# Patient Record
Sex: Female | Born: 2000 | Race: White | Hispanic: No | Smoking: Never smoker
Health system: Southern US, Community
[De-identification: ages and names within clinical notes are randomized; demographics above are authoritative.]

## PROBLEM LIST (undated history)

## (undated) DIAGNOSIS — K0889 Other specified disorders of teeth and supporting structures: Secondary | ICD-10-CM

## (undated) DIAGNOSIS — R600 Localized edema: Secondary | ICD-10-CM

## (undated) DIAGNOSIS — A6920 Lyme disease, unspecified: Secondary | ICD-10-CM

## (undated) DIAGNOSIS — M549 Dorsalgia, unspecified: Secondary | ICD-10-CM

## (undated) DIAGNOSIS — F418 Other specified anxiety disorders: Secondary | ICD-10-CM

## (undated) DIAGNOSIS — Z464 Encounter for fitting and adjustment of orthodontic device: Secondary | ICD-10-CM

## (undated) DIAGNOSIS — K219 Gastro-esophageal reflux disease without esophagitis: Secondary | ICD-10-CM

## (undated) DIAGNOSIS — D696 Thrombocytopenia, unspecified: Secondary | ICD-10-CM

## (undated) DIAGNOSIS — G43909 Migraine, unspecified, not intractable, without status migrainosus: Secondary | ICD-10-CM

## (undated) DIAGNOSIS — IMO0001 Reserved for inherently not codable concepts without codable children: Secondary | ICD-10-CM

## (undated) DIAGNOSIS — K5900 Constipation, unspecified: Secondary | ICD-10-CM

## (undated) DIAGNOSIS — N12 Tubulo-interstitial nephritis, not specified as acute or chronic: Secondary | ICD-10-CM

## (undated) HISTORY — DX: Migraine, unspecified, not intractable, without status migrainosus: G43.909

## (undated) HISTORY — DX: Dorsalgia, unspecified: M54.9

## (undated) HISTORY — DX: Constipation, unspecified: K59.00

## (undated) HISTORY — PX: NO PAST SURGERIES: SHX2092

## (undated) HISTORY — DX: Other specified anxiety disorders: F41.8

## (undated) HISTORY — DX: Other specified disorders of teeth and supporting structures: K08.89

## (undated) HISTORY — DX: Tubulo-interstitial nephritis, not specified as acute or chronic: N12

## (undated) HISTORY — PX: WISDOM TOOTH EXTRACTION: SHX21

## (undated) HISTORY — DX: Localized edema: R60.0

---
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
## 2019-06-13 NOTE — Progress Notes (Signed)
Frazier Richards, MD   Chief Complaint  Patient presents with  . Metrorrhagia    pt's last regular period was more than a year ago, had some spotting 3/20, started bleeding last night, was on OCP's but stopped about a year ago, no abnormal pain  . Hot Flashes    2-5 months ago through out the day    HPI:      [name] is a 18 y.o. No obstetric history on file. who LMP was Patient's last menstrual period was 06/13/2019 (exact date)., presents today for NP menstrual issues. Pt's menses used to be monthly, lasting 7 days, no BTB, with n/v and bad cramping with menses. Pt took NSAIDs with some improvement. Pt started on OCPs by PCP for dysmen in past and did continuous dosing with sx relief. Pt stopped OCPs about a yr ago and didn't resume menses. Had 1-2 days light spotting only 3/20, but started her period last night. Had seen PCP for sx eval but pt states no eval done and she just wanted pt to restart pills, so she started them last night with bleeding. Pt wonders why she didn't bleed after stopping pills. No known FH PCOS. 25# wt gain since 12/19. Had hot flashes a few months ago.  Pt is sex active, not using BC. Had neg UPTs at home, last one 1-2 months ago. Hx of constipation, depression.    Past Medical History:  Diagnosis Date  . Lyme disease   . Migraine   . Orthodontics    has bracees  . Reflux    as infant  . Thrombocytopenia ([place])     Past Surgical History:  Procedure Laterality Date  . NO PAST SURGERIES    . SEPTOPLASTY N/A 07/23/2016   Procedure: SEPTOPLASTY;  Surgeon: Clyde Canterbury, MD;  Location: [facility];  Service: ENT;  Laterality: N/A;    Family History  Problem Relation Age of Onset  . Juvenile Rhematoid Arthritis Sister   . Lupus Mother   . Fibromyalgia Mother     Social History   Socioeconomic History  . Marital status: Single    Spouse name: Not on file  . Number of children: Not on file  . Years of education: Not on file  .  Highest education level: Not on file  Occupational History  . Not on file  Social Needs  . Financial resource strain: Not on file  . Food insecurity    Worry: Not on file    Inability: Not on file  . Transportation needs    Medical: Not on file    Non-medical: Not on file  Tobacco Use  . Smoking status: Never Smoker  . Smokeless tobacco: Never Used  Substance and Sexual Activity  . Alcohol use: No  . Drug use: Never  . Sexual activity: Yes    Birth control/protection: None  Lifestyle  . Physical activity    Days per week: Not on file    Minutes per session: Not on file  . Stress: Not on file  Relationships  . Social Herbalist on phone: Not on file    Gets together: Not on file    Attends religious service: Not on file    Active member of club or organization: Not on file    Attends meetings of clubs or organizations: Not on file    Relationship status: Not on file  . Intimate partner violence    Fear of current or  ex partner: Not on file    Emotionally abused: Not on file    Physically abused: Not on file    Forced sexual activity: Not on file  Other Topics Concern  . Not on file  Social History Narrative  . Not on file    Outpatient Medications Prior to Visit  Medication Sig Dispense Refill  . cephALEXin (KEFLEX) 500 MG capsule TAKE 1 CAPSULE BY MOUTH TWICE A DAY FOR 7 DAYS, FOR INFECTION    . APRI 0.15-30 MG-MCG tablet Take 1 tablet by mouth daily.  3  . aluminum-magnesium hydroxide-simethicone (MAALOX) [id number] MG/5ML SUSP Take 30 mLs by mouth 4 (four) times daily -  before meals and at bedtime. 355 mL 0  . cefPROZIL (CEFZIL) 500 MG tablet Take 1 tablet (500 mg total) by mouth 2 (two) times daily. (Patient not taking: Reported on 05/31/2017) 20 tablet 0  . famotidine (PEPCID) 20 MG tablet Take 1 tablet (20 mg total) by mouth 2 (two) times daily. 60 tablet 0  . HYDROcodone-acetaminophen (NORCO/VICODIN) 5-325 MG tablet Take 1-2 tablets by mouth every 6  (six) hours as needed for moderate pain. (Patient not taking: Reported on 05/31/2017) 30 tablet 0  . ondansetron (ZOFRAN ODT) 4 MG disintegrating tablet Take 1 tablet (4 mg total) by mouth every 8 (eight) hours as needed for nausea or vomiting. 20 tablet 0  . predniSONE (STERAPRED UNI-PAK 21 TAB) 10 MG (21) TBPK tablet Take 6 tablets on day 1. Take 5 tablets on day 2. Take 4 tablets on day 3. Take 3 tablets on day 4. Take 2 tablets on day 5. Take 1 tablets on day 6. 21 tablet 0   No facility-administered medications prior to visit.       ROS:  Review of Systems  Constitutional: Negative for fatigue, fever and unexpected weight change.  Respiratory: Negative for cough, shortness of breath and wheezing.   Cardiovascular: Negative for chest pain, palpitations and leg swelling.  Gastrointestinal: Positive for constipation. Negative for blood in stool, diarrhea, nausea and vomiting.  Endocrine: Negative for cold intolerance, heat intolerance and polyuria.  Genitourinary: Negative for dyspareunia, dysuria, flank pain, frequency, genital sores, hematuria, menstrual problem, pelvic pain, urgency, vaginal bleeding, vaginal discharge and vaginal pain.  Musculoskeletal: Negative for back pain, joint swelling and myalgias.  Skin: Negative for rash.  Neurological: Positive for headaches. Negative for dizziness, syncope, light-headedness and numbness.  Hematological: Negative for adenopathy.  Psychiatric/Behavioral: Positive for agitation. Negative for confusion, sleep disturbance and suicidal ideas. The patient is not nervous/anxious.    BREAST: No symptoms   OBJECTIVE:   Vitals:  BP 96/60   Ht 5\' 4"  (1.626 m)   Wt 170 lb 9.6 oz (77.4 kg)   LMP 06/13/2019 (Exact Date)   BMI 29.28 kg/m   Physical Exam Vitals signs reviewed.  Constitutional:      Appearance: She is well-developed.  Neck:     Musculoskeletal: Normal range of motion.  Pulmonary:     Effort: Pulmonary effort is normal.   Genitourinary:    General: Normal vulva.     Pubic Area: No rash.      Labia:        Right: No rash, tenderness or lesion.        Left: No rash, tenderness or lesion.      Vagina: Normal. No vaginal discharge, erythema or tenderness.     Cervix: Normal.     Uterus: Normal. Not enlarged and not tender.  Adnexa: Right adnexa normal and left adnexa normal.       Right: No mass or tenderness.         Left: No mass or tenderness.    Musculoskeletal: Normal range of motion.  Skin:    General: Skin is warm and dry.  Neurological:     General: No focal deficit present.     Mental Status: She is alert and oriented to person, place, and time.  Psychiatric:        Mood and Affect: Mood normal.        Behavior: Behavior normal.        Thought Content: Thought content normal.        Judgment: Judgment normal.     Results: Results for orders placed or performed in visit on 06/14/19 (from the past 24 hour(s))  POCT urine pregnancy     Status: Normal   Collection Time: 06/14/19  2:49 PM  Result Value Ref Range   Preg Test, Ur Negative Negative     Assessment/Plan: Amenorrhea - Plan: POCT urine pregnancy, TSH + free T4, Prolactin; Since 3/20; Started spontaneous menses yesterday and pt started OCPs. Neg UPT today. Check labs today. If neg, reassurance and can eval further with labs in future once stops hormones. Could be due to wt gain. Since pt sex active, suggested OCP use for cycle control and pregnancy prevention. Condoms.   Thyroid disorder screening - Plan: TSH + free T4  Screening for STDs   Return if symptoms worsen or fail to improve.  Pleasant Bensinger B. Markiesha Delia, PA-C 06/14/2019 2:53 PM

## 2019-06-14 ENCOUNTER — Other Ambulatory Visit (HOSPITAL_COMMUNITY)
Admission: RE | Admit: 2019-06-14 | Discharge: 2019-06-14 | Disposition: A | Discharge status: Home / Self Care | Payer: Medicaid Other | Source: Ambulatory Visit | Attending: Obstetrics and Gynecology | Admitting: Obstetrics and Gynecology

## 2019-06-14 ENCOUNTER — Ambulatory Visit (INDEPENDENT_AMBULATORY_CARE_PROVIDER_SITE_OTHER): Payer: Medicaid Other | Admitting: Obstetrics and Gynecology

## 2019-06-14 ENCOUNTER — Encounter: Payer: Self-pay | Admitting: Obstetrics and Gynecology

## 2019-06-14 ENCOUNTER — Other Ambulatory Visit: Payer: Self-pay

## 2019-06-14 VITALS — BP 96/60 | Ht 64.0 in | Wt 170.6 lb

## 2019-06-14 DIAGNOSIS — Z113 Encounter for screening for infections with a predominantly sexual mode of transmission: Secondary | ICD-10-CM

## 2019-06-14 DIAGNOSIS — N912 Amenorrhea, unspecified: Secondary | ICD-10-CM

## 2019-06-14 DIAGNOSIS — Z1329 Encounter for screening for other suspected endocrine disorder: Secondary | ICD-10-CM | POA: Diagnosis not present

## 2019-06-14 DIAGNOSIS — Z3202 Encounter for pregnancy test, result negative: Secondary | ICD-10-CM

## 2019-06-14 LAB — POCT URINE PREGNANCY: Preg Test, Ur: NEGATIVE

## 2019-06-14 NOTE — Patient Instructions (Signed)
I value your feedback and entrusting us with your care. If you get a [facility] patient survey, I would appreciate you taking the time to let us know about your experience today. Thank you! 

## 2019-06-15 LAB — TSH+FREE T4
Free T4: 1.07 ng/dL (ref 0.93–1.60)
TSH: 2.05 u[IU]/mL (ref 0.450–4.500)

## 2019-06-15 LAB — PROLACTIN: Prolactin: 5.4 ng/mL (ref 4.8–23.3)

## 2019-06-15 NOTE — Progress Notes (Signed)
Pls let pt know labs normal. Cont OCPs. F/u for further eval after OCPs in future if menses don't resume. Thx

## 2019-06-15 NOTE — Progress Notes (Signed)
Called pt, no answer, LVMTRC. 

## 2019-06-16 LAB — CERVICOVAGINAL ANCILLARY ONLY
Chlamydia: NEGATIVE
Neisseria Gonorrhea: NEGATIVE

## 2019-06-16 NOTE — Progress Notes (Signed)
Called pt again, no answer, LVMTRC.

## 2020-02-06 NOTE — Progress Notes (Signed)
New Obstetric Patient H&P    Chief Complaint: "Desires prenatal care"   History of Present Illness: Vanessa Vincent is a 19 y.o. G1P0000 Not Hispanic or Latino female, LMP 12/09/2019 presents with amenorrhea and positive home pregnancy test. Based on her  LMP, her EDD is 09/14/2020. and her EGA is 8wk4d. She was taking OCPs continuously and was amenorrheic. She stopped her OCPs in January and her 2/18 LMP was her first menses off pills.  She had a urine pregnancy test which was positive around 01/09/2020.  Since her LMP she claims she has experienced nausea, vomiting, a brown discharge a few days ago, urinary frequency and headaches. Her nausea and vomiting is worse during the night and in the AM. Has taken Zofran 4 mgm on a couple of occasions. . She denies frank vaginal bleeding. Her past medical history is remarkable for common migraines, pyelonephritis (age 26) and frequent UTIs  She was also treated for anxiety/depression for a couple months at the end of last year. She self discontinued the medication after a couple months because she did not like how it made her feel. She was also getting counseling, but stopped when her therapist left the practice.   Since her LMP, she admits to the use of tobacco products  No SInce her LMP, she admits to the use of alcohol: No Since her LMP, she admits to the use of MJ. States was using daily until her +UPT. Has stopped smoking since then. She claims she has gained   no pounds since the start of her pregnancy.  There are cats in the home in the home  yes , 2 Outdoor cats (sister's cats) She admits close contact with children on a regular basis  yes  She has had chicken pox in the past no She has had Tuberculosis exposures, symptoms, or previously tested positive for TB   no Current or past history of domestic violence. no  Genetic Screening/Teratology Counseling: (Includes patient, baby's father, or anyone in either family with:)   109.  Patient's age >/= 38 at [facility]  no 2. Thalassemia ([place], [place], [place], or Asian background): MCV<80  no 3. Neural tube defect (meningomyelocele, spina bifida, anencephaly)  no 4. Congenital heart defect  Yes, possibly in FOB's cousin 5. Down syndrome  no 6. Tay-Sachs (Jewish, [place])  no 7. Canavan's Disease  no 8. Sickle cell disease or trait (African)  no  9. Hemophilia or other blood disorders  no  10. Muscular dystrophy  no  11. Cystic fibrosis  no  12. Huntington's Chorea  no  13. Mental retardation/autism  no 14. Other inherited genetic or chromosomal disorder  no 15. Maternal metabolic disorder (DM, PKU, etc)  no 16. Patient or FOB with a child with a birth defect not listed above no  16a. Patient or FOB with a birth defect themselves no 17. Recurrent pregnancy loss, or stillbirth  no  18. Any medications since LMP other than prenatal vitamins (include vitamins, supplements, OTC meds, drugs, alcohol)  Yes, Zofran 4 mgm and prenatal vitamins 19. Any other genetic/environmental exposure to discuss  no  Infection History:   1. Lives with someone with TB or TB exposed  no  2. Patient or partner has history of genital herpes  no 3. Rash or viral illness since LMP  no 4. History of STI (GC, CT, HPV, syphilis, HIV)  no 5. History of recent travel :  no  Other pertinent information:  Yes, FOB is Christy Sartorius, age 45. Both Christy Sartorius and patient are currently unemployed.    Review of Systems:10 point review of systems negative unless otherwise noted in HPI  Past Medical History:  Past Medical History:  Diagnosis Date  . Anxiety with depression   . Lyme disease   . Migraine   . Orthodontics    has bracees  . Pyelonephritis   . Reflux    as infant  . Thrombocytopenia ([place])     Past Surgical History:  Past Surgical History:  Procedure Laterality Date  . SEPTOPLASTY N/A 07/23/2016   Procedure: SEPTOPLASTY;  Surgeon: Clyde Canterbury, MD;  Location: [facility];  Service: ENT;  Laterality: N/A;    Gynecologic History: Patient's last menstrual period was 12/09/2019.  Obstetric History: G0P0000  Family History:  Family History  Problem Relation Age of Onset  . Juvenile Rhematoid Arthritis Sister   . Lupus Mother   . Fibromyalgia Mother   . Hypertension Mother   . Breast cancer Maternal Grandmother   . Diabetes Sister   . Heart disease Maternal Aunt   . Cancer Maternal Aunt     Social History:  Social History   Socioeconomic History  . Marital status: Significant Other    Spouse name: Christy Sartorius  . Number of children: Not on file  . Years of education: Not on file  . Highest education level: Not on file  Occupational History  . Not on file  Tobacco Use  . Smoking status: Never Smoker  . Smokeless tobacco: Never Used  Substance and Sexual Activity  . Alcohol use: No  . Drug use: Not Currently    Types: Marijuana  . Sexual activity: Yes    Partners: Male    Birth control/protection: None  Other Topics Concern  . Not on file  Social History Narrative  . Not on file   Social Determinants of Health   Financial Resource Strain:   . Difficulty of Paying Living Expenses:   Food Insecurity:   . Worried About Charity fundraiser in the Last Year:   . Arboriculturist in the Last Year:   Transportation Needs:   . Film/video editor (Medical):   Marland Kitchen Lack of Transportation (Non-Medical):   Physical Activity:   . Days of Exercise per Week:   . Minutes of Exercise per Session:   Stress:   . Feeling of Stress :   Social Connections:   . Frequency of Communication with Friends and Family:   . Frequency of Social Gatherings with Friends and Family:   . Attends Religious Services:   . Active Member of Clubs or Organizations:   . Attends Archivist Meetings:   Marland Kitchen Marital Status:   Intimate Partner Violence:   . Fear of Current or Ex-Partner:   . Emotionally Abused:   Marland Kitchen Physically Abused:   . Sexually Abused:      Allergies:  No Known Allergies  Medications:  Current Outpatient Medications:  .  ondansetron (ZOFRAN) 4 MG tablet, Take 4 mg by mouth every 6 (six) hours as needed for nausea or vomiting., Disp: , Rfl:  .  Prenatal Vit-Fe Fumarate-FA (MULTIVITAMIN-PRENATAL) 27-0.8 MG TABS tablet, Take 1 tablet by mouth daily at 12 noon., Disp: , Rfl:   Physical Exam Vitals: BP 116/74   Wt 158 lb (71.7 kg)   LMP 12/09/2019   BMI 27.12 kg/m   General: WF in NAD HEENT: normocephalic, anicteric; PEARLA  Mouth: moist mucous membranes, good  dentition Thyroid: no enlargement, no palpable nodules Pulmonary: No increased work of breathing, CTAB Breasts, symmetrical, no masses, no skin or nipple retraction Cardiovascular: RRR, without murmur Abdomen: soft, non-tender, non-distended.  Umbilicus without lesions.  No hepatomegaly or masses palpable. No evidence of hernia  Genitourinary:  External: Normal external female genitalia.  Normal urethral meatus, normal Bartholin's and Skene's glands.    Vagina: Normal vaginal mucosa, no evidence of prolapse.    Cervix: closed, no bleeding  Uterus: RF,  6-8 weeks enlarged, mobile, normal contour.  Adnexa: ovaries non-enlarged, no adnexal masses  Rectal: deferred Extremities: no edema, erythema, or tenderness Neurologic: Grossly intact Psychiatric: mood appropriate, affect full   Assessment: 19 y.o. G1P0000  Presenting at 8wk4d by approximate LMP to initiate prenatal care Hx of pyelo and frequent UTIS Hx of anxiety/depression  Plan: 1) Patient encouraged to stop smoking marijuana 2) RX for Diclegis sent to pharmacy 3) Take prenatal vitamins daily.  5) Nutrition, food safety (fish, cheese advisories, and high nitrite foods) and exercise discussed. 6) Hospital and practice style discussed with cross coverage system.  7) Genetic Screening, such as with 1st Trimester Screening, cell free fetal DNA, AFP testing, and Ultrasound, is discussed with patient. At  the conclusion of today's visit patient is undecided about genetic testing 8) Patient is asked about travel to areas at risk for the Zika virus, and counseled to avoid travel and exposure to mosquitoes or sexual partners who may have themselves been exposed to the virus. Testing is discussed, and will be ordered as appropriate.  9) NOB labs today, Aptima done. Wet prep was negative today 10) Dating scan and ROB in 1 week 11) Frequent urine cultures during pregnancy 12) At risk for worsening PPD/ PP anxiety  Dalia Heading, CNM

## 2020-02-07 ENCOUNTER — Other Ambulatory Visit: Payer: Self-pay

## 2020-02-07 ENCOUNTER — Ambulatory Visit (INDEPENDENT_AMBULATORY_CARE_PROVIDER_SITE_OTHER): Payer: Medicaid Other | Admitting: Certified Nurse Midwife

## 2020-02-07 ENCOUNTER — Encounter: Payer: Self-pay | Admitting: Certified Nurse Midwife

## 2020-02-07 VITALS — BP 116/74 | Ht 64.0 in | Wt 158.0 lb

## 2020-02-07 DIAGNOSIS — F32A Depression, unspecified: Secondary | ICD-10-CM | POA: Insufficient documentation

## 2020-02-07 DIAGNOSIS — Z3A08 8 weeks gestation of pregnancy: Secondary | ICD-10-CM

## 2020-02-07 DIAGNOSIS — Z3400 Encounter for supervision of normal first pregnancy, unspecified trimester: Secondary | ICD-10-CM

## 2020-02-07 DIAGNOSIS — Z87448 Personal history of other diseases of urinary system: Secondary | ICD-10-CM

## 2020-02-07 DIAGNOSIS — F419 Anxiety disorder, unspecified: Secondary | ICD-10-CM

## 2020-02-07 DIAGNOSIS — Z3403 Encounter for supervision of normal first pregnancy, third trimester: Secondary | ICD-10-CM | POA: Insufficient documentation

## 2020-02-07 DIAGNOSIS — Z789 Other specified health status: Secondary | ICD-10-CM

## 2020-02-07 DIAGNOSIS — F329 Major depressive disorder, single episode, unspecified: Secondary | ICD-10-CM

## 2020-02-07 DIAGNOSIS — Z113 Encounter for screening for infections with a predominantly sexual mode of transmission: Secondary | ICD-10-CM

## 2020-02-07 LAB — OB RESULTS CONSOLE VARICELLA ZOSTER ANTIBODY, IGG: Varicella: IMMUNE

## 2020-02-07 MED ORDER — DOXYLAMINE-PYRIDOXINE 10-10 MG PO TBEC: 2.0000 | DELAYED_RELEASE_TABLET | Freq: Every day | ORAL | 5 refills | Status: DC

## 2020-02-07 NOTE — Progress Notes (Signed)
NOB   Confirmed at [facility]

## 2020-02-08 LAB — RPR+RH+ABO+RUB AB+AB SCR+CB...
Antibody Screen: NEGATIVE
HIV Screen 4th Generation wRfx: NONREACTIVE
Hematocrit: 38.9 % (ref 34.0–46.6)
Hemoglobin: 13.5 g/dL (ref 11.1–15.9)
Hepatitis B Surface Ag: NEGATIVE
MCH: 30.3 pg (ref 26.6–33.0)
MCHC: 34.7 g/dL (ref 31.5–35.7)
MCV: 87 fL (ref 79–97)
Platelets: 137 10*3/uL — ABNORMAL LOW (ref 150–450)
RBC: 4.46 x10E6/uL (ref 3.77–5.28)
RDW: 12.9 % (ref 11.7–15.4)
RPR Ser Ql: NONREACTIVE
Rh Factor: POSITIVE
Rubella Antibodies, IGG: 7.39 index (ref >0.99)
Varicella zoster IgG: 862 index (ref >165)
WBC: 5.6 10*3/uL (ref 3.4–10.8)

## 2020-02-08 LAB — URINE DRUG PANEL 7
Amphetamines, Urine: NEGATIVE ng/mL
Barbiturate Quant, Ur: NEGATIVE ng/mL
Benzodiazepine Quant, Ur: NEGATIVE ng/mL
Cannabinoid Quant, Ur: NEGATIVE ng/mL
Cocaine (Metab.): NEGATIVE ng/mL
Opiate Quant, Ur: NEGATIVE ng/mL
PCP Quant, Ur: NEGATIVE ng/mL

## 2020-02-09 ENCOUNTER — Ambulatory Visit (INDEPENDENT_AMBULATORY_CARE_PROVIDER_SITE_OTHER): Payer: Medicaid Other | Admitting: Obstetrics and Gynecology

## 2020-02-09 ENCOUNTER — Other Ambulatory Visit: Payer: Self-pay

## 2020-02-09 VITALS — BP 129/92 | Wt 158.0 lb

## 2020-02-09 DIAGNOSIS — O200 Threatened abortion: Secondary | ICD-10-CM | POA: Diagnosis not present

## 2020-02-09 DIAGNOSIS — O209 Hemorrhage in early pregnancy, unspecified: Secondary | ICD-10-CM | POA: Diagnosis not present

## 2020-02-09 LAB — CHLAMYDIA/GONOCOCCUS/TRICHOMONAS, NAA
Chlamydia by NAA: NEGATIVE
Gonococcus by NAA: NEGATIVE
Trich vag by NAA: NEGATIVE

## 2020-02-09 LAB — URINE CULTURE

## 2020-02-09 NOTE — Telephone Encounter (Signed)
Patient is schedule 02/09/20 with SDJ

## 2020-02-09 NOTE — Progress Notes (Signed)
Obstetrics & Gynecology Office Visit   Chief Complaint  Patient presents with  . First trimester bleeding  Vaginal bleeding in early pregnancy  History of Present Illness: 19 y.o. G36P0000 female who presents with bright red vaginal bleeding.  She has passed clots, as well.  She began by having brown discharge this morning around 9-10 AM.  Later she had the heavier bleeding and cramping.  She still has bright red bleeding with blood clots. She has not yet had an ultrasound for this pregnancy.  She can think of no inciting factors.  She was in her usual state of health apart from some pregnancy-associated nausea.  With the bleeding she has had to change pads twice.   Past Medical History:  Diagnosis Date  . Anxiety with depression   . Lyme disease   . Migraine   . Orthodontics    has bracees  . Pyelonephritis   . Reflux    as infant  . Thrombocytopenia ([place])     Past Surgical History:  Procedure Laterality Date  . SEPTOPLASTY N/A 07/23/2016   Procedure: SEPTOPLASTY;  Surgeon: Clyde Canterbury, MD;  Location: [place];  Service: ENT;  Laterality: N/A;    Gynecologic History: Patient's last menstrual period was 12/09/2019.  Obstetric History: G1P0000  Family History  Problem Relation Age of Onset  . Juvenile Rhematoid Arthritis Sister   . Lupus Mother   . Fibromyalgia Mother   . Hypertension Mother   . Breast cancer Maternal Grandmother   . Diabetes Sister   . Heart disease Maternal Aunt   . Cancer Maternal Aunt     Social History   Socioeconomic History  . Marital status: Significant Other    Spouse name: Christy Sartorius  . Number of children: Not on file  . Years of education: Not on file  . Highest education level: Not on file  Occupational History  . Not on file  Tobacco Use  . Smoking status: Never Smoker  . Smokeless tobacco: Never Used  Substance and Sexual Activity  . Alcohol use: No  . Drug use: Not Currently    Types: Marijuana  . Sexual activity:  Yes    Partners: Male    Birth control/protection: None  Other Topics Concern  . Not on file  Social History Narrative  . Not on file   Social Determinants of Health   Financial Resource Strain:   . Difficulty of Paying Living Expenses:   Food Insecurity:   . Worried About Charity fundraiser in the Last Year:   . Arboriculturist in the Last Year:   Transportation Needs:   . Film/video editor (Medical):   Marland Kitchen Lack of Transportation (Non-Medical):   Physical Activity:   . Days of Exercise per Week:   . Minutes of Exercise per Session:   Stress:   . Feeling of Stress :   Social Connections:   . Frequency of Communication with Friends and Family:   . Frequency of Social Gatherings with Friends and Family:   . Attends Religious Services:   . Active Member of Clubs or Organizations:   . Attends Archivist Meetings:   Marland Kitchen Marital Status:   Intimate Partner Violence:   . Fear of Current or Ex-Partner:   . Emotionally Abused:   Marland Kitchen Physically Abused:   . Sexually Abused:     No Known Allergies  Prior to Admission medications   Medication Sig Start Date End Date Taking? Authorizing Provider  Doxylamine-Pyridoxine (  DICLEGIS) 10-10 MG TBEC Take 2 tablets by mouth at bedtime. If symptoms persist, add one tablet in the morning and one in the afternoon 02/07/20   Dalia Heading, CNM  Prenatal Vit-Fe Fumarate-FA (MULTIVITAMIN-PRENATAL) 27-0.8 MG TABS tablet Take 1 tablet by mouth daily at 12 noon.    [provider]    Review of Systems  Constitutional: Negative.   HENT: Negative.   Eyes: Negative.   Respiratory: Negative.   Cardiovascular: Negative.   Gastrointestinal: Positive for abdominal pain (lower abdomen), nausea and vomiting. Negative for blood in stool, constipation, diarrhea and melena.  Genitourinary: Negative.        See HPI  Musculoskeletal: Negative.   Skin: Negative.   Neurological: Negative.   Psychiatric/Behavioral: Negative.       Physical Exam BP (!) 129/92   Wt 158 lb (71.7 kg)   LMP 12/09/2019   BMI 27.12 kg/m  Patient's last menstrual period was 12/09/2019. Physical Exam Constitutional:      General: She is not in acute distress.    Appearance: Normal appearance. She is well-developed.  Genitourinary:     Pelvic exam was performed with patient in the lithotomy position.     Vulva, inguinal canal, urethra, bladder, vagina, uterus, right adnexa and left adnexa normal.     No posterior fourchette tenderness, injury or lesion present.     No cervical friability, lesion, bleeding or polyp.     Genitourinary Comments: Small amount of bleeding in vaginal vault  HENT:     Head: Normocephalic and atraumatic.  Eyes:     General: No scleral icterus.    Conjunctiva/sclera: Conjunctivae normal.  Cardiovascular:     Rate and Rhythm: Normal rate and regular rhythm.     Heart sounds: No murmur. No friction rub. No gallop.   Pulmonary:     Effort: Pulmonary effort is normal. No respiratory distress.     Breath sounds: Normal breath sounds. No wheezing or rales.  Abdominal:     General: Bowel sounds are normal. There is no distension.     Palpations: Abdomen is soft. There is no mass.     Tenderness: There is no abdominal tenderness. There is no guarding or rebound.  Musculoskeletal:        General: Normal range of motion.     Cervical back: Normal range of motion and neck supple.  Neurological:     General: No focal deficit present.     Mental Status: She is alert and oriented to person, place, and time.     Cranial Nerves: No cranial nerve deficit.  Skin:    General: Skin is warm and dry.     Findings: No erythema.  Psychiatric:        Mood and Affect: Mood normal.        Behavior: Behavior normal.        Judgment: Judgment normal.    Bedside transvaginal ultrasound. Female chaperone present Uterus with single, living intrauterine pregnancy with normal cardiac activity. Medium sub-chorionic hemorrhage  noted.  The gestational sac is located in the fundal portion of the uterus. No free pelvic fluid noted.   Female chaperone present for pelvic and breast  portions of the physical exam  Assessment: 19 y.o. G78P0000 female here for  1. Vaginal bleeding in pregnancy, first trimester   2. Threatened abortion      Plan: Problem List Items Addressed This Visit    None    Visit Diagnoses    Vaginal bleeding in  pregnancy, first trimester    -  Primary   Threatened abortion         Patient reassured that pregnancy is intact at this time.  Discussed that vaginal bleeding does occurs in pregnancies that go on to be normal, viable pregnancy. However, the outcome of the pregnancy is not certain, given the vaginal bleeding. Will continue to monitor.  Will have her keep her next regularly schedule appointment, or come in as needed.    Prentice Docker, MD 02/09/2020 3:22 PM

## 2020-02-10 ENCOUNTER — Encounter: Payer: Self-pay | Admitting: Obstetrics and Gynecology

## 2020-02-16 ENCOUNTER — Encounter: Payer: Self-pay | Admitting: Obstetrics and Gynecology

## 2020-02-16 ENCOUNTER — Ambulatory Visit (INDEPENDENT_AMBULATORY_CARE_PROVIDER_SITE_OTHER): Payer: Medicaid Other

## 2020-02-16 ENCOUNTER — Other Ambulatory Visit: Payer: Self-pay | Admitting: Certified Nurse Midwife

## 2020-02-16 ENCOUNTER — Ambulatory Visit (INDEPENDENT_AMBULATORY_CARE_PROVIDER_SITE_OTHER): Payer: Medicaid Other | Admitting: Obstetrics and Gynecology

## 2020-02-16 ENCOUNTER — Other Ambulatory Visit: Payer: Self-pay

## 2020-02-16 VITALS — BP 122/82 | Wt 157.0 lb

## 2020-02-16 DIAGNOSIS — O210 Mild hyperemesis gravidarum: Secondary | ICD-10-CM

## 2020-02-16 DIAGNOSIS — Z789 Other specified health status: Secondary | ICD-10-CM | POA: Diagnosis not present

## 2020-02-16 DIAGNOSIS — Z3400 Encounter for supervision of normal first pregnancy, unspecified trimester: Secondary | ICD-10-CM

## 2020-02-16 DIAGNOSIS — Z3401 Encounter for supervision of normal first pregnancy, first trimester: Secondary | ICD-10-CM

## 2020-02-16 DIAGNOSIS — Z3A08 8 weeks gestation of pregnancy: Secondary | ICD-10-CM

## 2020-02-16 LAB — POCT URINALYSIS DIPSTICK OB: Glucose, UA: NEGATIVE

## 2020-02-16 MED ORDER — PROMETHAZINE HCL 25 MG PO TABS: 25.0000 mg | ORAL_TABLET | Freq: Four times a day (QID) | ORAL | 3 refills | Status: DC | PRN

## 2020-02-16 MED ORDER — PROMETHAZINE HCL 25 MG RE SUPP: 25.0000 mg | Freq: Four times a day (QID) | RECTAL | 0 refills | Status: DC | PRN

## 2020-02-16 MED ORDER — ONDANSETRON 4 MG PO TBDP: 4.0000 mg | ORAL_TABLET | Freq: Four times a day (QID) | ORAL | 6 refills | Status: DC | PRN

## 2020-02-16 NOTE — Patient Instructions (Addendum)
Initial steps to help :   B6 (pyridoxine) 25 mg,  3-4 times a day, (150mg  a day total) Unisom (doxylamine) 25 mg at bedtime **B6 and Unisom are available as a combination prescription medications called diclegis and bonjesta  B1 (thiamin)  50-100 mg 1-4 a day,  (150mg  a day total)  Continue prenatal vitamin with iron and thiamin. If it is not tolerated switch to 1 mg of folic acid.  Can add medication for gastric reflux if needed.  Subsequent steps to be added to B1, B6, and Unisom:  1. Antihistamine (one of the following medications) Dramamine      25-50 mg every 4-6 hours Benadryl      25-50 mg every 4-6 hours Meclizine      25 mg every 6 hours  2. Dopamine Antagonist (one of the following medications) Metoclopramide  (Reglan)  5-10 mg every 6-8 hours         PO Promethazine   (Phenergan)   12.5-25 mg every 4-6 hours      PO or rectal Prochlorperazine  (Compazine)  5-10 mg every 6-8 hours     25mg  BID rectally    Subsequent steps if there has still not been improvement in symptoms:  3. Daily stool softner- Colace 100 mg twice a day  4. Ondansetron  (Zofran)   4-8 mg every 6-8 hours     Hyperemesis Gravidarum Hyperemesis gravidarum is a severe form of nausea and vomiting that happens during pregnancy. Hyperemesis is worse than morning sickness. It may cause you to have nausea or vomiting all day for many days. It may keep you from eating and drinking enough food and liquids, which can lead to dehydration, malnutrition, and weight loss. Hyperemesis usually occurs during the first half (the first 20 weeks) of pregnancy. It often goes away once a woman is in her second half of pregnancy. However, sometimes hyperemesis continues through an entire pregnancy. What are the causes? The cause of this condition is not known. It may be related to changes in chemicals (hormones) in the body during pregnancy, such as the high level of pregnancy hormone (human chorionic gonadotropin) or the  increase in the female sex hormone (estrogen). What are the signs or symptoms? Symptoms of this condition include:  Nausea that does not go away.  Vomiting that does not allow you to keep any food down.  Weight loss.  Body fluid loss (dehydration).  Having no desire to eat, or not liking food that you have previously enjoyed. How is this diagnosed? This condition may be diagnosed based on:  A physical exam.  Your medical history.  Your symptoms.  Blood tests.  Urine tests. How is this treated? This condition is managed by controlling symptoms. This may include:  Following an eating plan. This can help lessen nausea and vomiting.  Taking prescription medicines. An eating plan and medicines are often used together to help control symptoms. If medicines do not help relieve nausea and vomiting, you may need to receive fluids through an IV at the hospital. Follow these instructions at home: Eating and drinking   Avoid the following: ? Drinking fluids with meals. Try not to drink anything during the 30 minutes before and after your meals. ? Drinking more than 1 cup of fluid at a time. ? Eating foods that trigger your symptoms. These may include spicy foods, coffee, high-fat foods, very sweet foods, and acidic foods. ? Skipping meals. Nausea can be more intense on an empty stomach. If you  cannot tolerate food, do not force it. Try sucking on ice chips or other frozen items and make up for missed calories later. ? Lying down within 2 hours after eating. ? Being exposed to environmental triggers. These may include food smells, smoky rooms, closed spaces, rooms with strong smells, warm or humid places, overly loud and noisy rooms, and rooms with motion or flickering lights. Try eating meals in a well-ventilated area that is free of strong smells. ? Quick and sudden changes in your movement. ? Taking iron pills and multivitamins that contain iron. If you take prescription iron pills,  do not stop taking them unless your health care provider approves. ? Preparing food. The smell of food can spoil your appetite or trigger nausea.  To help relieve your symptoms: ? Listen to your body. Everyone is different and has different preferences. Find what works best for you. ? Eat and drink slowly. ? Eat 5-6 small meals daily instead of 3 large meals. Eating small meals and snacks can help you avoid an empty stomach. ? In the morning, before getting out of bed, eat a couple of crackers to avoid moving around on an empty stomach. ? Try eating starchy foods as these are usually tolerated well. Examples include cereal, toast, bread, potatoes, pasta, rice, and pretzels. ? Include at least 1 serving of protein with your meals and snacks. Protein options include lean meats, poultry, seafood, beans, nuts, nut butters, eggs, cheese, and yogurt. ? Try eating a protein-rich snack before bed. Examples of a protein-rick snack include cheese and crackers or a peanut butter sandwich made with 1 slice of whole-wheat bread and 1 tsp (5 g) of peanut butter. ? Eat or suck on things that have ginger in them. It may help relieve nausea. Add  tsp ground ginger to hot tea or choose ginger tea. ? Try drinking 100% fruit juice or an electrolyte drink. An electrolyte drink contains sodium, potassium, and chloride. ? Drink fluids that are cold, clear, and carbonated or sour. Examples include lemonade, ginger ale, lemon-lime soda, ice water, and sparkling water. ? Brush your teeth or use a mouth rinse after meals. ? Talk with your health care provider about starting a supplement of vitamin B6. General instructions  Take over-the-counter and prescription medicines only as told by your health care provider.  Follow instructions from your health care provider about eating or drinking restrictions.  Continue to take your prenatal vitamins as told by your health care provider. If you are having trouble taking your  prenatal vitamins, talk with your health care provider about different options.  Keep all follow-up and pre-birth (prenatal) visits as told by your health care provider. This is important. Contact a health care provider if:  You have pain in your abdomen.  You have a severe headache.  You have vision problems.  You are losing weight.  You feel weak or dizzy. Get help right away if:  You cannot drink fluids without vomiting.  You vomit blood.  You have constant nausea and vomiting.  You are very weak.  You faint.  You have a fever and your symptoms suddenly get worse. Summary  Hyperemesis gravidarum is a severe form of nausea and vomiting that happens during pregnancy.  Making some changes to your eating habits may help relieve nausea and vomiting.  This condition may be managed with medicine.  If medicines do not help relieve nausea and vomiting, you may need to receive fluids through an IV at the hospital. This  information is not intended to replace advice given to you by your health care provider. Make sure you discuss any questions you have with your health care provider. Document Revised: 10/27/2017 Document Reviewed: 06/05/2016 Elsevier Patient Education  [place].

## 2020-02-17 NOTE — Progress Notes (Signed)
    Routine Prenatal Care Visit  Subjective  Timesha Schupp is a 19 y.o. G1P0000 at [redacted]w[redacted]d being seen today for ongoing prenatal care.  She is currently monitored for the following issues for this low-risk pregnancy and has Pneumonia; Fever presenting with conditions classified elsewhere; Hyponatremia; Dehydration; Petechiae; History of pyelonephritis; Uncontrollable vomiting; Amenorrhea; Supervision of low-risk first pregnancy; and Anxiety and depression on their problem list.  ----------------------------------------------------------------------------------- Patient reports nausea and vomiting.    .  .   . Denies leaking of fluid.  ----------------------------------------------------------------------------------- The following portions of the patient's history were reviewed and updated as appropriate: allergies, current medications, past family history, past medical history, past social history, past surgical history and problem list. Problem list updated.   Objective  Blood pressure 122/82, weight 157 lb (71.2 kg), last menstrual period 12/09/2019. Pregravid weight 155 lb (70.3 kg) Total Weight Gain 2 lb (0.907 kg) Urinalysis:      Fetal Status:           General:  Alert, oriented and cooperative. Patient is in no acute distress.  Skin: Skin is warm and dry. No rash noted.   Cardiovascular: Normal heart rate noted  Respiratory: Normal respiratory effort, no problems with respiration noted  Abdomen: Soft, gravid, appropriate for gestational age.       Pelvic:  Cervical exam deferred        Extremities: Normal range of motion.     Mental Status: Normal mood and affect. Normal behavior. Normal judgment and thought content.     Assessment   19 y.o. G1P0000 at [redacted]w[redacted]d by  09/27/2020, by Ultrasound presenting for routine prenatal visit  Plan   Pregnancy#1 Problems (from 12/16/19 to present)    Problem Noted Resolved   Supervision of low-risk first pregnancy 02/07/2020 by Dalia Heading, CNM No   Overview Addendum 02/17/2020  2:04 PM by Homero Fellers, [place] Prenatal Labs  Dating  8wk [place] Blood type: O/Positive/-- (04/19 1611)   Genetic Screen 1 Screen:     AFP:      Quad:      NIPS:    Antibody:Negative (04/19 1611)  Anatomic [place]  Rubella: 7.39 (04/19 1611)   Varicella: Immune  GTT  28 wk:      RPR: Non Reactive (04/19 1611)   Rhogam  not needed HBsAg: Negative (04/19 1611)   TDaP vaccine                       HIV: Non Reactive (04/19 1611)   Flu Shot                                GBS:   Contraception  Pap: under 21  CBB     CS/VBAC    Baby Food    Support Person                Discussed management of hyperemesis. Provided the patient with medications. Provided information on hyperemesis  Gestational age appropriate obstetric precautions including but not limited to vaginal bleeding, contractions, leaking of fluid and fetal movement were reviewed in detail with the patient.    Return in about 2 weeks (around 03/01/2020) for ROB in person.  Homero Fellers MD [facility], [facility] 02/17/2020, 2:01 PM

## 2020-03-01 ENCOUNTER — Other Ambulatory Visit: Payer: Self-pay

## 2020-03-01 ENCOUNTER — Ambulatory Visit (INDEPENDENT_AMBULATORY_CARE_PROVIDER_SITE_OTHER): Payer: Medicaid Other | Admitting: Certified Nurse Midwife

## 2020-03-01 VITALS — BP 100/66 | Wt 156.0 lb

## 2020-03-01 DIAGNOSIS — Z1379 Encounter for other screening for genetic and chromosomal anomalies: Secondary | ICD-10-CM

## 2020-03-01 DIAGNOSIS — Z3401 Encounter for supervision of normal first pregnancy, first trimester: Secondary | ICD-10-CM

## 2020-03-01 DIAGNOSIS — Z3A10 10 weeks gestation of pregnancy: Secondary | ICD-10-CM

## 2020-03-01 LAB — POCT URINALYSIS DIPSTICK OB
Glucose, UA: NEGATIVE
POC,PROTEIN,UA: NEGATIVE

## 2020-03-01 NOTE — Progress Notes (Signed)
ROB at 10 weeks: No further vaginal bleeding and nausea has decreased. Not using any antiemetics at this time. Desires cell free DNA testing.   FH at SP+2-3 FB. FHT 175 with the DT Wt down 1# in last 2 weeks  A: IUP at 10 weeks  P: MaterniT21 today ROB in 4 weeks  Dalia Heading, CNM

## 2020-03-01 NOTE — Progress Notes (Signed)
ROB- no concerns 

## 2020-03-07 ENCOUNTER — Encounter: Payer: Self-pay | Admitting: Certified Nurse Midwife

## 2020-03-07 ENCOUNTER — Ambulatory Visit (INDEPENDENT_AMBULATORY_CARE_PROVIDER_SITE_OTHER): Payer: Medicaid Other | Admitting: Certified Nurse Midwife

## 2020-03-07 ENCOUNTER — Other Ambulatory Visit: Payer: Self-pay

## 2020-03-07 VITALS — BP 104/78 | Wt 156.0 lb

## 2020-03-07 DIAGNOSIS — Z3A10 10 weeks gestation of pregnancy: Secondary | ICD-10-CM

## 2020-03-07 DIAGNOSIS — O26891 Other specified pregnancy related conditions, first trimester: Secondary | ICD-10-CM

## 2020-03-07 DIAGNOSIS — R300 Dysuria: Secondary | ICD-10-CM

## 2020-03-07 LAB — POCT URINALYSIS DIPSTICK
Blood, UA: NEGATIVE
Glucose, UA: NEGATIVE
Ketones, UA: POSITIVE
Leukocytes, UA: NEGATIVE
Protein, UA: NEGATIVE

## 2020-03-07 MED ORDER — CEPHALEXIN 500 MG PO CAPS: 500.0000 mg | ORAL_CAPSULE | Freq: Three times a day (TID) | ORAL | 0 refills | Status: DC

## 2020-03-07 NOTE — Progress Notes (Signed)
Work in at [employer] of intermittent dysuria and continued frequency. No vulvar itching or swelling or unusual discharge. Has had intermittent dysuria for months on and off, but has worsened over the last few days. She feels better if she puts a heating pad between her legs or takes a hot shower.   Exam: General: in NAD FHTs 161  Pelvic: External/BUS: no inflammation, lesions or discharge Vagina: scant white discharge Cervix: no lesions  Wet prep: negative hyphae, Trich, Clue cells. Many lactobacillis  Results for orders placed or performed in visit on 03/07/20 (from the past 24 hour(s))  POCT urinalysis dipstick     Status: None   Collection Time: 03/07/20  2:50 PM  Result Value Ref Range   Color, UA     Clarity, UA     Glucose, UA Negative Negative   Bilirubin, UA     Ketones, UA positive    Spec Grav, UA     Blood, UA negative    pH, UA     Protein, UA Negative Negative   Urobilinogen, UA     Nitrite, UA     Leukocytes, UA Negative Negative   Appearance     Odor      A: IUP at 10wk6d with dysuria R/O UTI No evidence of vaginitis  P: Urine culture Start Keflex 500 mgm tid while awaiting urine culture results Increase water intake  Dalia Heading, CNM

## 2020-03-07 NOTE — Telephone Encounter (Signed)
Please have her come in today around 1145. Thanks

## 2020-03-08 LAB — MATERNIT 21 PLUS CORE, BLOOD
Fetal Fraction: 4
Result (T21): NEGATIVE
Trisomy 13 (Patau syndrome): NEGATIVE
Trisomy 18 (Edwards syndrome): NEGATIVE
Trisomy 21 (Down syndrome): NEGATIVE

## 2020-03-09 LAB — URINE CULTURE: Organism ID, Bacteria: NO GROWTH

## 2020-03-17 ENCOUNTER — Encounter: Payer: Self-pay | Admitting: Certified Nurse Midwife

## 2020-03-31 ENCOUNTER — Other Ambulatory Visit: Payer: Self-pay

## 2020-03-31 ENCOUNTER — Ambulatory Visit (INDEPENDENT_AMBULATORY_CARE_PROVIDER_SITE_OTHER): Payer: Medicaid Other | Admitting: Certified Nurse Midwife

## 2020-03-31 ENCOUNTER — Encounter: Payer: Self-pay | Admitting: Certified Nurse Midwife

## 2020-03-31 VITALS — BP 106/64 | Wt 155.0 lb

## 2020-03-31 DIAGNOSIS — Z3A14 14 weeks gestation of pregnancy: Secondary | ICD-10-CM

## 2020-03-31 DIAGNOSIS — R300 Dysuria: Secondary | ICD-10-CM

## 2020-03-31 DIAGNOSIS — Z3401 Encounter for supervision of normal first pregnancy, first trimester: Secondary | ICD-10-CM

## 2020-03-31 DIAGNOSIS — O26892 Other specified pregnancy related conditions, second trimester: Secondary | ICD-10-CM

## 2020-03-31 LAB — POCT URINALYSIS DIPSTICK OB
Glucose, UA: NEGATIVE
POC,PROTEIN,UA: NEGATIVE

## 2020-03-31 NOTE — Progress Notes (Signed)
ROB at 14wk2d: Completed Keflex for UTI symptoms with some decrease in symptoms, but continues to have intermittent dysuria. No frequency. Urine culture from last visit was no growth.   EXam: FH at 1/2 between U and SP. FHT 152. BP 106/64 negative proteinuria  A: IUP at 14wk2d with intermittent dysuria  P: Urine Aptima Desires MSAFP ROB in 2 weeks  Dalia Heading, CNM

## 2020-03-31 NOTE — Progress Notes (Signed)
ROB- no concerns 

## 2020-04-02 LAB — CHLAMYDIA/GONOCOCCUS/TRICHOMONAS, NAA
Chlamydia by NAA: NEGATIVE
Gonococcus by NAA: NEGATIVE
Trich vag by NAA: NEGATIVE

## 2020-04-14 ENCOUNTER — Ambulatory Visit (INDEPENDENT_AMBULATORY_CARE_PROVIDER_SITE_OTHER): Payer: Medicaid Other | Admitting: Obstetrics

## 2020-04-14 ENCOUNTER — Other Ambulatory Visit: Payer: Self-pay

## 2020-04-14 ENCOUNTER — Encounter: Payer: Medicaid Other | Admitting: Advanced Practice Midwife

## 2020-04-14 VITALS — BP 100/60 | Wt 157.0 lb

## 2020-04-14 DIAGNOSIS — Z3402 Encounter for supervision of normal first pregnancy, second trimester: Secondary | ICD-10-CM

## 2020-04-14 DIAGNOSIS — Z3A16 16 weeks gestation of pregnancy: Secondary | ICD-10-CM

## 2020-04-14 DIAGNOSIS — Z3401 Encounter for supervision of normal first pregnancy, first trimester: Secondary | ICD-10-CM

## 2020-04-14 LAB — POCT URINALYSIS DIPSTICK OB
Glucose, UA: NEGATIVE
POC,PROTEIN,UA: NEGATIVE

## 2020-04-14 NOTE — Progress Notes (Signed)
Here at 16 week 2 days for ROB visit. Denies any problems. Does want the MSAFP testing today. Had MaternT testing- negative. O: See VS above. FHTs 150s A: IUP 16 w2 days P:  MSAFP drawn. Discussed this testing in more detail.      RTC in 2 weeks for anatomy ultrasound and ROB. Imagene Riches, CNM  04/14/2020 11:57 AM

## 2020-04-14 NOTE — Addendum Note (Signed)
Addended by: Quintella Baton D on: 04/14/2020 01:12 PM   Modules accepted: Orders

## 2020-04-16 LAB — AFP, SERUM, OPEN SPINA BIFIDA
AFP MoM: 0.42
AFP Value: 14.2 ng/mL
Gest. Age on Collection Date: 16 weeks
Maternal Age At EDD: 20.3 yr
OSBR Risk 1 IN: 10000
Test Results:: NEGATIVE
Weight: 157 [lb_av]

## 2020-04-17 ENCOUNTER — Telehealth: Payer: Self-pay

## 2020-04-17 NOTE — Telephone Encounter (Signed)
Patient returning missed call. Patient is at work and would like a call after 4 pm today. Please advise

## 2020-04-17 NOTE — Telephone Encounter (Signed)
Pt aware.

## 2020-04-17 NOTE — Telephone Encounter (Signed)
Pt called after hour nurse 04/16/20 2:10pm; recv'd test results in MyChart; would like to discuss to make sure she understands correctly.  [phone]  Was adv to call office today for results.  [facility]  MS-AFP negative.

## 2020-05-02 ENCOUNTER — Encounter: Payer: Self-pay | Admitting: Obstetrics and Gynecology

## 2020-05-02 NOTE — Telephone Encounter (Signed)
Pls route to OB doing msgs today. Thx.

## 2020-05-03 ENCOUNTER — Other Ambulatory Visit: Payer: Self-pay

## 2020-05-03 ENCOUNTER — Ambulatory Visit (INDEPENDENT_AMBULATORY_CARE_PROVIDER_SITE_OTHER): Payer: Medicaid Other

## 2020-05-03 ENCOUNTER — Ambulatory Visit (INDEPENDENT_AMBULATORY_CARE_PROVIDER_SITE_OTHER): Payer: Medicaid Other | Admitting: Obstetrics

## 2020-05-03 VITALS — BP 118/74 | Wt 158.0 lb

## 2020-05-03 DIAGNOSIS — Z3A19 19 weeks gestation of pregnancy: Secondary | ICD-10-CM

## 2020-05-03 DIAGNOSIS — R300 Dysuria: Secondary | ICD-10-CM

## 2020-05-03 DIAGNOSIS — R519 Headache, unspecified: Secondary | ICD-10-CM

## 2020-05-03 DIAGNOSIS — Z3401 Encounter for supervision of normal first pregnancy, first trimester: Secondary | ICD-10-CM | POA: Diagnosis not present

## 2020-05-03 DIAGNOSIS — Z3400 Encounter for supervision of normal first pregnancy, unspecified trimester: Secondary | ICD-10-CM

## 2020-05-03 DIAGNOSIS — O26892 Other specified pregnancy related conditions, second trimester: Secondary | ICD-10-CM

## 2020-05-03 LAB — POCT URINALYSIS DIPSTICK
Bilirubin, UA: NEGATIVE
Blood, UA: NEGATIVE
Glucose, UA: NEGATIVE
Ketones, UA: NEGATIVE
Leukocytes, UA: NEGATIVE
Nitrite, UA: NEGATIVE
Protein, UA: POSITIVE — AB
Spec Grav, UA: 1.02 (ref 1.010–1.025)
Urobilinogen, UA: NEGATIVE E.U./dL — AB
pH, UA: 6.5 (ref 5.0–8.0)

## 2020-05-03 NOTE — Progress Notes (Signed)
Routine Prenatal Care Visit  Subjective  Vanessa Vincent is a 19 y.o. G1P0000 at [redacted]w[redacted]d being seen today for ongoing prenatal care.  She is currently monitored for the following issues for this low-risk pregnancy and has History of pyelonephritis; Uncontrollable vomiting; Supervision of low-risk first pregnancy; and Anxiety and depression on their problem list.  ----------------------------------------------------------------------------------- Patient reports some dysuria noted when urinating. She has a hx of pyelonephritis..  When urinating , she afterwards feels external irritation. She has been using a heating pad to address this.  She denies any internal irritation. Use baby wipes after urination. Also puts Vaseline on her labia and mons after shaving.No itching or external rashes or lesions reported. 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 They respond somewhat to Tylenol, caffeine. Asking about treatment. She does not take the Tylenol with any regularity; does not use sunglasses. No formal evaluation for headaches. Contractions: Not present. Vag. Bleeding: None.  Movement: Absent. Leaking Fluid denies.  ----------------------------------------------------------------------------------- The following portions of the patient's history were reviewed and updated as appropriate: allergies, current medications, past family history, past medical history, past social history, past surgical history and problem list. Problem list updated.  Objective  Blood pressure 118/74, weight 158 lb (71.7 kg), last menstrual period 12/09/2019. Pregravid weight 155 lb (70.3 kg) Total Weight Gain 3 lb (1.361 kg) Urinalysis: Urine Protein    Urine Glucose    Fetal Status:     Movement: Absent     General:  Alert, oriented and cooperative. Patient is in no acute distress.  Skin: Skin is warm and dry. No rash noted. Perineum is shaved, no lesions or rashes or irritation noted  visually. Wet mount neg for yeast, whiff or clue cells.  Cardiovascular: Normal heart rate noted  Respiratory: Normal respiratory effort, no problems with respiration noted  Abdomen: Soft, gravid, appropriate for gestational age. Pain/Pressure: Absent     Pelvic:  Cervical exam deferred        Extremities: Normal range of motion.     Mental Status: Normal mood and affect. Normal behavior. Normal judgment and thought content.   Assessment   19 y.o. G1P0000 at [redacted]w[redacted]d by  09/27/2020, by Ultrasound presenting for routine prenatal visit Hx of pyelo nephritis-urine is negative for nitrites, leuks. Skin irritation after urinating- Suspect external irritation from shaving perineum and use of vaseline for shaving. Advised to start repeat dosing with Tylenol and some caffeine when having headaches. To f/u with the office if no relief- would consider Fioricet if needed.  Plan   Pregnancy#1 Problems (from 12/16/19 to present)    Problem Noted Resolved   Supervision of low-risk first pregnancy 02/07/2020 by Dalia Heading, CNM No   Overview Addendum 05/03/2020 11:17 AM by Imagene Riches, [place] Prenatal Labs  Dating  8wk [place] Blood type: O/Positive/-- (04/19 1611)   Genetic Screen 1 Screen:     AFP:     negative Quad:      NIPS:   Diploid XX Antibody:Negative (04/19 1611)  Anatomic [place]  normal female Rubella: 7.39 (04/19 1611)   Varicella: Immune  GTT  28 wk:      RPR: Non Reactive (04/19 1611)   Rhogam  not needed HBsAg: Negative (04/19 1611)   TDaP vaccine                       HIV: Non Reactive (04/19 1611)   Flu Shot  GBS:   Contraception  Pap: under 21  CBB     CS/VBAC    Baby Food    Support Person             Previous Version       Preterm labor symptoms and general obstetric precautions including but not limited to vaginal bleeding, contractions, leaking of fluid and fetal movement were reviewed in detail with the  patient. Please refer to After Visit Summary for other counseling recommendations.   Advised to start repeat dosing with Tylenol with headaches, try caffeine. F/U with office if they become chronic. To stop Vaseline use on her labia and perineum.  Return in about 4 weeks (around 05/31/2020) for return OB.  Imagene Riches, CNM  05/03/2020 11:19 AM

## 2020-05-03 NOTE — Progress Notes (Signed)
No vb. No lof. Anatomy scan today.

## 2020-05-30 ENCOUNTER — Encounter: Payer: Self-pay | Admitting: Obstetrics and Gynecology

## 2020-05-31 ENCOUNTER — Other Ambulatory Visit: Payer: Self-pay

## 2020-05-31 ENCOUNTER — Encounter: Payer: Self-pay | Admitting: Advanced Practice Midwife

## 2020-05-31 ENCOUNTER — Ambulatory Visit (INDEPENDENT_AMBULATORY_CARE_PROVIDER_SITE_OTHER): Payer: Medicaid Other | Admitting: Advanced Practice Midwife

## 2020-05-31 VITALS — BP 120/70 | Ht 64.0 in | Wt 162.0 lb

## 2020-05-31 DIAGNOSIS — Z131 Encounter for screening for diabetes mellitus: Secondary | ICD-10-CM

## 2020-05-31 DIAGNOSIS — Z3A23 23 weeks gestation of pregnancy: Secondary | ICD-10-CM

## 2020-05-31 DIAGNOSIS — Z3402 Encounter for supervision of normal first pregnancy, second trimester: Secondary | ICD-10-CM

## 2020-05-31 DIAGNOSIS — Z130 Encounter for screening for diseases of the blood and blood-forming organs and certain disorders involving the immune mechanism: Secondary | ICD-10-CM

## 2020-05-31 DIAGNOSIS — Z113 Encounter for screening for infections with a predominantly sexual mode of transmission: Secondary | ICD-10-CM

## 2020-05-31 LAB — POCT URINALYSIS DIPSTICK OB
Glucose, UA: NEGATIVE
POC,PROTEIN,UA: NEGATIVE

## 2020-05-31 NOTE — Progress Notes (Signed)
  Routine Prenatal Care Visit  Subjective  Vanessa Vincent is a 19 y.o. G1P0000 at 105w0d being seen today for ongoing prenatal care.  She is currently monitored for the following issues for this low-risk pregnancy and has History of pyelonephritis; Uncontrollable vomiting; Supervision of low-risk first pregnancy; and Anxiety and depression on their problem list.  ----------------------------------------------------------------------------------- Patient reports no complaints.   Contractions: Not present. Vag. Bleeding: None.  Movement: Present. Leaking Fluid denies.  ----------------------------------------------------------------------------------- The following portions of the patient's history were reviewed and updated as appropriate: allergies, current medications, past family history, past medical history, past social history, past surgical history and problem list. Problem list updated.  Objective  Blood pressure 120/70, height 5\' 4"  (1.626 m), weight 162 lb (73.5 kg), last menstrual period 12/09/2019. Pregravid weight 155 lb (70.3 kg) Total Weight Gain 7 lb (3.175 kg) Urinalysis: Urine Protein    Urine Glucose    Fetal Status: Fetal Heart Rate (bpm): 147 Fundal Height: 23 cm Movement: Present     General:  Alert, oriented and cooperative. Patient is in no acute distress.  Skin: Skin is warm and dry. No rash noted.   Cardiovascular: Normal heart rate noted  Respiratory: Normal respiratory effort, no problems with respiration noted  Abdomen: Soft, gravid, appropriate for gestational age. Pain/Pressure: Absent     Pelvic:  Cervical exam deferred        Extremities: Normal range of motion.  Edema: None  Mental Status: Normal mood and affect. Normal behavior. Normal judgment and thought content.   Assessment   19 y.o. G1P0000 at [redacted]w[redacted]d by  09/27/2020, by Ultrasound presenting for routine prenatal visit  Plan   Pregnancy#1 Problems (from 12/16/19 to present)    Problem Noted Resolved    Supervision of low-risk first pregnancy 02/07/2020 by Dalia Heading, CNM No   Overview Addendum 05/03/2020 11:17 AM by Imagene Riches, [place] Prenatal Labs  Dating  8wk [place] Blood type: O/Positive/-- (04/19 1611)   Genetic Screen 1 Screen:     AFP:     negative Quad:      NIPS:   Diploid XX Antibody:Negative (04/19 1611)  Anatomic [place]  normal female Rubella: 7.39 (04/19 1611)   Varicella: Immune  GTT  28 wk:      RPR: Non Reactive (04/19 1611)   Rhogam  not needed HBsAg: Negative (04/19 1611)   TDaP vaccine                       HIV: Non Reactive (04/19 1611)   Flu Shot                                GBS:   Contraception  Pap: under 21  CBB     CS/VBAC    Baby Food    Support Person             Previous Version       Preterm labor symptoms and general obstetric precautions including but not limited to vaginal bleeding, contractions, leaking of fluid and fetal movement were reviewed in detail with the patient.    Return in about 4 weeks (around 06/28/2020) for 28 wk labs and rob.  Rod Can, CNM 05/31/2020 11:33 AM

## 2020-06-30 ENCOUNTER — Other Ambulatory Visit: Payer: Medicaid Other

## 2020-06-30 ENCOUNTER — Other Ambulatory Visit: Payer: Self-pay

## 2020-06-30 ENCOUNTER — Ambulatory Visit (INDEPENDENT_AMBULATORY_CARE_PROVIDER_SITE_OTHER): Payer: Medicaid Other | Admitting: Obstetrics

## 2020-06-30 VITALS — BP 100/60 | Wt 164.0 lb

## 2020-06-30 DIAGNOSIS — Z130 Encounter for screening for diseases of the blood and blood-forming organs and certain disorders involving the immune mechanism: Secondary | ICD-10-CM

## 2020-06-30 DIAGNOSIS — Z3A27 27 weeks gestation of pregnancy: Secondary | ICD-10-CM

## 2020-06-30 DIAGNOSIS — Z3402 Encounter for supervision of normal first pregnancy, second trimester: Secondary | ICD-10-CM

## 2020-06-30 DIAGNOSIS — Z131 Encounter for screening for diabetes mellitus: Secondary | ICD-10-CM

## 2020-06-30 DIAGNOSIS — Z113 Encounter for screening for infections with a predominantly sexual mode of transmission: Secondary | ICD-10-CM

## 2020-06-30 LAB — POCT URINALYSIS DIPSTICK OB
Glucose, UA: NEGATIVE
POC,PROTEIN,UA: NEGATIVE

## 2020-06-30 NOTE — Progress Notes (Signed)
  Routine Prenatal Care Visit  Subjective  Vanessa Vincent is a 19 y.o. G1P0000 at [redacted]w[redacted]d being seen today for ongoing prenatal care.  She is currently monitored for the following issues for this low-risk pregnancy and has History of pyelonephritis; Uncontrollable vomiting; Supervision of low-risk first pregnancy; and Anxiety and depression on their problem list.  ----------------------------------------------------------------------------------- Patient reports no complaints.    .  .   Vanessa Vincent Fluid denies.  ----------------------------------------------------------------------------------- The following portions of the patient's history were reviewed and updated as appropriate: allergies, current medications, past family history, past medical history, past social history, past surgical history and problem list. Problem list updated.  Objective  Blood pressure 100/60, weight 164 lb (74.4 kg), last menstrual period 12/09/2019. Pregravid weight 155 lb (70.3 kg) Total Weight Gain 9 lb (4.082 kg) Urinalysis: Urine Protein Negative  Urine Glucose Negative  Fetal Status:           General:  Alert, oriented and cooperative. Patient is in no acute distress.  Skin: Skin is warm and dry. No rash noted.   Cardiovascular: Normal heart rate noted  Respiratory: Normal respiratory effort, no problems with respiration noted  Abdomen: Soft, gravid, appropriate for gestational age.       Pelvic:  Cervical exam deferred        Extremities: Normal range of motion.     Mental Status: Normal mood and affect. Normal behavior. Normal judgment and thought content.   Assessment   19 y.o. G1P0000 at [redacted]w[redacted]d by  09/27/2020, by Ultrasound presenting for routine prenatal visit  Plan   Pregnancy#1 Problems (from 12/16/19 to present)    Problem Noted Resolved   Supervision of low-risk first pregnancy 02/07/2020 by Dalia Heading, CNM No   Overview Addendum 05/03/2020 11:17 AM by Imagene Riches, [place] Prenatal Labs  Dating  8wk [place] Blood type: O/Positive/-- (04/19 1611)   Genetic Screen 1 Screen:     AFP:     negative Quad:      NIPS:   Diploid XX Antibody:Negative (04/19 1611)  Anatomic [place]  normal female Rubella: 7.39 (04/19 1611)   Varicella: Immune  GTT  28 wk:      RPR: Non Reactive (04/19 1611)   Rhogam  not needed HBsAg: Negative (04/19 1611)   TDaP vaccine                       HIV: Non Reactive (04/19 1611)   Flu Shot                                GBS:   Contraception  Pap: under 21  CBB     CS/VBAC    Baby Food    Support Person             Previous Version       Preterm labor symptoms and general obstetric precautions including but not limited to vaginal bleeding, contractions, leaking of fluid and fetal movement were reviewed in detail with the patient. Please refer to After Visit Summary for other counseling recommendations.  Her 28 week labs are drawn today. Offered tdap for next visit.  Return in about 2 weeks (around 07/14/2020) for return OB.  Imagene Riches, CNM  06/30/2020 10:06 AM

## 2020-06-30 NOTE — Progress Notes (Signed)
No concerns.rj 

## 2020-07-01 LAB — 28 WEEK RH+PANEL
Basophils Absolute: 0 10*3/uL (ref 0.0–0.2)
Basos: 0 %
EOS (ABSOLUTE): 0.1 10*3/uL (ref 0.0–0.4)
Eos: 1 %
Gestational Diabetes Screen: 89 mg/dL (ref 65–139)
HIV Screen 4th Generation wRfx: NONREACTIVE
Hematocrit: 33.5 % — ABNORMAL LOW (ref 34.0–46.6)
Hemoglobin: 11.5 g/dL (ref 11.1–15.9)
Immature Grans (Abs): 0 10*3/uL (ref 0.0–0.1)
Immature Granulocytes: 1 %
Lymphocytes Absolute: 1.4 10*3/uL (ref 0.7–3.1)
Lymphs: 16 %
MCH: 31.5 pg (ref 26.6–33.0)
MCHC: 34.3 g/dL (ref 31.5–35.7)
MCV: 92 fL (ref 79–97)
Monocytes Absolute: 0.5 10*3/uL (ref 0.1–0.9)
Monocytes: 6 %
Neutrophils Absolute: 6.6 10*3/uL (ref 1.4–7.0)
Neutrophils: 76 %
Platelets: 133 10*3/uL — ABNORMAL LOW (ref 150–450)
RBC: 3.65 x10E6/uL — ABNORMAL LOW (ref 3.77–5.28)
RDW: 12.6 % (ref 11.7–15.4)
RPR Ser Ql: NONREACTIVE
WBC: 8.6 10*3/uL (ref 3.4–10.8)

## 2020-07-14 ENCOUNTER — Encounter: Payer: Self-pay | Admitting: Obstetrics & Gynecology

## 2020-07-14 ENCOUNTER — Ambulatory Visit (INDEPENDENT_AMBULATORY_CARE_PROVIDER_SITE_OTHER): Payer: Medicaid Other | Admitting: Obstetrics & Gynecology

## 2020-07-14 ENCOUNTER — Other Ambulatory Visit: Payer: Self-pay

## 2020-07-14 VITALS — BP 120/78 | Wt 168.0 lb

## 2020-07-14 DIAGNOSIS — Z3A29 29 weeks gestation of pregnancy: Secondary | ICD-10-CM | POA: Diagnosis not present

## 2020-07-14 DIAGNOSIS — Z23 Encounter for immunization: Secondary | ICD-10-CM

## 2020-07-14 DIAGNOSIS — Z3403 Encounter for supervision of normal first pregnancy, third trimester: Secondary | ICD-10-CM

## 2020-07-14 LAB — POCT URINALYSIS DIPSTICK OB
Glucose, UA: NEGATIVE
POC,PROTEIN,UA: NEGATIVE

## 2020-07-14 NOTE — Patient Instructions (Signed)
Third Trimester of Pregnancy The third trimester is from week 28 through week 40 (months 7 through 9). The third trimester is a time when the unborn baby (fetus) is growing rapidly. At the end of the ninth month, the fetus is about 20 inches in length and weighs 6-10 pounds. Body changes during your third trimester Your body will continue to go through many changes during pregnancy. The changes vary from woman to woman. During the third trimester:  Your weight will continue to increase. You can expect to gain 25-35 pounds (11-16 kg) by the end of the pregnancy.  You may begin to get stretch marks on your hips, abdomen, and breasts.  You may urinate more often because the fetus is moving lower into your pelvis and pressing on your bladder.  You may develop or continue to have heartburn. This is caused by increased hormones that slow down muscles in the digestive tract.  You may develop or continue to have constipation because increased hormones slow digestion and cause the muscles that push waste through your intestines to relax.  You may develop hemorrhoids. These are swollen veins (varicose veins) in the rectum that can itch or be painful.  You may develop swollen, bulging veins (varicose veins) in your legs.  You may have increased body aches in the pelvis, back, or thighs. This is due to weight gain and increased hormones that are relaxing your joints.  You may have changes in your hair. These can include thickening of your hair, rapid growth, and changes in texture. Some women also have hair loss during or after pregnancy, or hair that feels dry or thin. Your hair will most likely return to normal after your baby is born.  Your breasts will continue to grow and they will continue to become tender. A yellow fluid (colostrum) may leak from your breasts. This is the first milk you are producing for your baby.  Your belly button may stick out.  You may notice more swelling in your hands,  face, or ankles.  You may have increased tingling or numbness in your hands, arms, and legs. The skin on your belly may also feel numb.  You may feel short of breath because of your expanding uterus.  You may have more problems sleeping. This can be caused by the size of your belly, increased need to urinate, and an increase in your body's metabolism.  You may notice the fetus "dropping," or moving lower in your abdomen (lightening).  You may have increased vaginal discharge.  You may notice your joints feel loose and you may have pain around your pelvic bone. What to expect at prenatal visits You will have prenatal exams every 2 weeks until week 36. Then you will have weekly prenatal exams. During a routine prenatal visit:  You will be weighed to make sure you and the baby are growing normally.  Your blood pressure will be taken.  Your abdomen will be measured to track your baby's growth.  The fetal heartbeat will be listened to.  Any test results from the previous visit will be discussed.  You may have a cervical check near your due date to see if your cervix has softened or thinned (effaced).  You will be tested for Group B streptococcus. This happens between 35 and 37 weeks. Your health care provider may ask you:  What your birth plan is.  How you are feeling.  If you are feeling the baby move.  If you have had any abnormal   symptoms, such as leaking fluid, bleeding, severe headaches, or abdominal cramping.  If you are using any tobacco products, including cigarettes, chewing tobacco, and electronic cigarettes.  If you have any questions. Other tests or screenings that may be performed during your third trimester include:  Blood tests that check for low iron levels (anemia).  Fetal testing to check the health, activity level, and growth of the fetus. Testing is done if you have certain medical conditions or if there are problems during the pregnancy.  Nonstress test  (NST). This test checks the health of your baby to make sure there are no signs of problems, such as the baby not getting enough oxygen. During this test, a belt is placed around your belly. The baby is made to move, and its heart rate is monitored during movement. What is false labor? False labor is a condition in which you feel small, irregular tightenings of the muscles in the womb (contractions) that usually go away with rest, changing position, or drinking water. These are called Braxton Hicks contractions. Contractions may last for hours, days, or even weeks before true labor sets in. If contractions come at regular intervals, become more frequent, increase in intensity, or become painful, you should see your health care provider. What are the signs of labor?  Abdominal cramps.  Regular contractions that start at 10 minutes apart and become stronger and more frequent with time.  Contractions that start on the top of the uterus and spread down to the lower abdomen and back.  Increased pelvic pressure and dull back pain.  A watery or bloody mucus discharge that comes from the vagina.  Leaking of amniotic fluid. This is also known as your "water breaking." It could be a slow trickle or a gush. Let your health care provider know if it has a color or strange odor. If you have any of these signs, call your health care provider right away, even if it is before your due date. Follow these instructions at home: Medicines  Follow your health care provider's instructions regarding medicine use. Specific medicines may be either safe or unsafe to take during pregnancy.  Take a prenatal vitamin that contains at least 600 micrograms (mcg) of folic acid.  If you develop constipation, try taking a stool softener if your health care provider approves. Eating and drinking   Eat a balanced diet that includes fresh fruits and vegetables, whole grains, good sources of protein such as meat, eggs, or tofu,  and low-fat dairy. Your health care provider will help you determine the amount of weight gain that is right for you.  Avoid raw meat and uncooked cheese. These carry germs that can cause birth defects in the baby.  If you have low calcium intake from food, talk to your health care provider about whether you should take a daily calcium supplement.  Eat four or five small meals rather than three large meals a day.  Limit foods that are high in fat and processed sugars, such as fried and sweet foods.  To prevent constipation: ? Drink enough fluid to keep your urine clear or pale yellow. ? Eat foods that are high in fiber, such as fresh fruits and vegetables, whole grains, and beans. Activity  Exercise only as directed by your health care provider. Most women can continue their usual exercise routine during pregnancy. Try to exercise for 30 minutes at least 5 days a week. Stop exercising if you experience uterine contractions.  Avoid heavy lifting.  Do   not exercise in extreme heat or humidity, or at high altitudes.  Wear low-heel, comfortable shoes.  Practice good posture.  You may continue to have sex unless your health care provider tells you otherwise. Relieving pain and discomfort  Take frequent breaks and rest with your legs elevated if you have leg cramps or low back pain.  Take warm sitz baths to soothe any pain or discomfort caused by hemorrhoids. Use hemorrhoid cream if your health care provider approves.  Wear a good support bra to prevent discomfort from breast tenderness.  If you develop varicose veins: ? Wear support pantyhose or compression stockings as told by your healthcare provider. ? Elevate your feet for 15 minutes, 3-4 times a day. Prenatal care  Write down your questions. Take them to your prenatal visits.  Keep all your prenatal visits as told by your health care provider. This is important. Safety  Wear your seat belt at all times when driving.  Make  a list of emergency phone numbers, including numbers for family, friends, the hospital, and police and fire departments. General instructions  Avoid cat litter boxes and soil used by cats. These carry germs that can cause birth defects in the baby. If you have a cat, ask someone to clean the litter box for you.  Do not travel far distances unless it is absolutely necessary and only with the approval of your health care provider.  Do not use hot tubs, steam rooms, or saunas.  Do not drink alcohol.  Do not use any products that contain nicotine or tobacco, such as cigarettes and e-cigarettes. If you need help quitting, ask your health care provider.  Do not use any medicinal herbs or unprescribed drugs. These chemicals affect the formation and growth of the baby.  Do not douche or use tampons or scented sanitary pads.  Do not cross your legs for long periods of time.  To prepare for the arrival of your baby: ? Take prenatal classes to understand, practice, and ask questions about labor and delivery. ? Make a trial run to the hospital. ? Visit the hospital and tour the maternity area. ? Arrange for maternity or paternity leave through employers. ? Arrange for family and friends to take care of pets while you are in the hospital. ? Purchase a rear-facing car seat and make sure you know how to install it in your car. ? Pack your hospital bag. ? Prepare the baby's nursery. Make sure to remove all pillows and stuffed animals from the baby's crib to prevent suffocation.  Visit your dentist if you have not gone during your pregnancy. Use a soft toothbrush to brush your teeth and be gentle when you floss. Contact a health care provider if:  You are unsure if you are in labor or if your water has broken.  You become dizzy.  You have mild pelvic cramps, pelvic pressure, or nagging pain in your abdominal area.  You have lower back pain.  You have persistent nausea, vomiting, or  diarrhea.  You have an unusual or bad smelling vaginal discharge.  You have pain when you urinate. Get help right away if:  Your water breaks before 37 weeks.  You have regular contractions less than 5 minutes apart before 37 weeks.  You have a fever.  You are leaking fluid from your vagina.  You have spotting or bleeding from your vagina.  You have severe abdominal pain or cramping.  You have rapid weight loss or weight gain.  You have   shortness of breath with chest pain.  You notice sudden or extreme swelling of your face, hands, ankles, feet, or legs.  Your baby makes fewer than 10 movements in 2 hours.  You have severe headaches that do not go away when you take medicine.  You have vision changes. Summary  The third trimester is from week 28 through week 40, months 7 through 9. The third trimester is a time when the unborn baby (fetus) is growing rapidly.  During the third trimester, your discomfort may increase as you and your baby continue to gain weight. You may have abdominal, leg, and back pain, sleeping problems, and an increased need to urinate.  During the third trimester your breasts will keep growing and they will continue to become tender. A yellow fluid (colostrum) may leak from your breasts. This is the first milk you are producing for your baby.  False labor is a condition in which you feel small, irregular tightenings of the muscles in the womb (contractions) that eventually go away. These are called Braxton Hicks contractions. Contractions may last for hours, days, or even weeks before true labor sets in.  Signs of labor can include: abdominal cramps; regular contractions that start at 10 minutes apart and become stronger and more frequent with time; watery or bloody mucus discharge that comes from the vagina; increased pelvic pressure and dull back pain; and leaking of amniotic fluid. This information is not intended to replace advice given to you by your  health care provider. Make sure you discuss any questions you have with your health care provider. Document Revised: 01/28/2019 Document Reviewed: 11/12/2016 Elsevier Patient Education  2020 [employer]  

## 2020-07-14 NOTE — Progress Notes (Signed)
°  Subjective  Fetal Movement? yes Contractions? no Leaking Fluid? no Vaginal Bleeding? no  Objective  BP 120/78    Wt 168 lb (76.2 kg)    LMP 12/09/2019    BMI 28.84 kg/m  General: NAD Pumonary: no increased work of breathing Abdomen: gravid, non-tender Extremities: no edema Psychiatric: mood appropriate, affect full  Assessment  19 y.o. G1P0000 at [redacted]w[redacted]d by  09/27/2020, by Ultrasound presenting for routine prenatal visit  Plan   Problem List Items Addressed This Visit      Other   Supervision of low-risk first pregnancy    Other Visit Diagnoses    Need for immunization against influenza    -  Primary   Relevant Orders   Flu Vaccine QUAD 36+ mos IM (Completed)   [redacted] weeks gestation of pregnancy       Relevant Orders   POC Urinalysis Dipstick OB (Completed)      Pregnancy#1 Problems (from 12/16/19 to present)    Problem Noted Resolved   Supervision of low-risk first pregnancy 02/07/2020 by Dalia Heading, CNM No   Overview Addendum 07/14/2020 10:50 AM by Gae Dry, MD      Clinic [facility] Prenatal Labs  Dating  8wk [place] Blood type: O/Positive/-- (04/19 1611)   Genetic Screen AFP:  NEG    NIPS:   Diploid XX Antibody:Negative (04/19 1611)  Anatomic [place]  normal female Rubella: 7.39 (04/19 1611)   Varicella: Immune  GTT  28 wk:      RPR: Non Reactive (04/19 1611)   Rhogam  not needed HBsAg: Negative (04/19 1611)   TDaP vaccine                       HIV: Non Reactive (04/19 1611)   Flu Shot        07/14/20                        GBS:   Contraception POP Pap: under 21  CBB  no   CS/VBAC n/a   Baby Food Breast   Support Person            Covid vaccine discussed, considering FLu vaccine today TDap nv PNV, [place] Plans POP for [facility] Plans breast feeding  Barnett Applebaum, MD, [facility], [facility] 07/14/2020  10:57 AM

## 2020-07-20 ENCOUNTER — Telehealth: Payer: Self-pay

## 2020-07-20 NOTE — Telephone Encounter (Signed)
Pt called after hour nurse 07/19/20 7:23pm; 30wks; lost mucous plug.  After hour nurse paged on call who was JEG.  JEG adv no need to be seen; to call office to be seen; reasons to be seen: water breaks, decreased fetal movement, or contractions (labor) develops. I adv pt that losing mucous plug doesn't mean immediate labor; she is at the point where she will have ctxs, we just don't want her to have more than 4 an hour; explained diff between true ctx and B-H ctxs.  Pt voiced understanding.

## 2020-07-28 ENCOUNTER — Ambulatory Visit (INDEPENDENT_AMBULATORY_CARE_PROVIDER_SITE_OTHER): Payer: Medicaid Other | Admitting: Obstetrics

## 2020-07-28 ENCOUNTER — Other Ambulatory Visit: Payer: Self-pay

## 2020-07-28 VITALS — BP 102/70 | Wt 169.0 lb

## 2020-07-28 DIAGNOSIS — Z23 Encounter for immunization: Secondary | ICD-10-CM | POA: Diagnosis not present

## 2020-07-28 DIAGNOSIS — Z3A31 31 weeks gestation of pregnancy: Secondary | ICD-10-CM

## 2020-07-28 DIAGNOSIS — Z3403 Encounter for supervision of normal first pregnancy, third trimester: Secondary | ICD-10-CM

## 2020-07-28 LAB — POCT URINALYSIS DIPSTICK OB
Glucose, UA: NEGATIVE
POC,PROTEIN,UA: NEGATIVE

## 2020-07-28 NOTE — Progress Notes (Signed)
No concerns.rj 

## 2020-07-28 NOTE — Progress Notes (Signed)
Routine Prenatal Care Visit  Subjective  Vanessa Vincent is a 19 y.o. G1P0000 at [redacted]w[redacted]d being seen today for ongoing prenatal care.  She is currently monitored for the following issues for this low-risk pregnancy and has History of pyelonephritis; Uncontrollable vomiting; Supervision of low-risk first pregnancy; and Anxiety and depression on their problem list.  ----------------------------------------------------------------------------------- Patient reports no complaints and she has taken the CBE course at [facility]. Asking many questions about water birth, pain meds for labor.  Interested in avoiding epidural anesthesia; wants to shower, use water for pain control  .  .   . Leaking Fluid denies.  ----------------------------------------------------------------------------------- The following portions of the patient's history were reviewed and updated as appropriate: allergies, current medications, past family history, past medical history, past social history, past surgical history and problem list. Problem list updated.  Objective  Blood pressure 102/70, weight 169 lb (76.7 kg), last menstrual period 12/09/2019. Pregravid weight 155 lb (70.3 kg) Total Weight Gain 14 lb (6.35 kg) Urinalysis: Urine Protein    Urine Glucose    Fetal Status:           General:  Alert, oriented and cooperative. Patient is in no acute distress.  Skin: Skin is warm and dry. No rash noted.   Cardiovascular: Normal heart rate noted  Respiratory: Normal respiratory effort, no problems with respiration noted  Abdomen: Soft, gravid, appropriate for gestational age.       Pelvic:  Cervical exam deferred        Extremities: Normal range of motion.     Mental Status: Normal mood and affect. Normal behavior. Normal judgment and thought content.   Assessment   19 y.o. G1P0000 at [redacted]w[redacted]d by  09/27/2020, by Ultrasound presenting for routine prenatal visit  Plan   Pregnancy#1 Problems (from 12/16/19 to present)     Problem Noted Resolved   Supervision of low-risk first pregnancy 02/07/2020 by Dalia Heading, CNM No   Overview Addendum 07/28/2020  1:57 PM by Imagene Riches, [place] Prenatal Labs  Dating  8wk [place] Blood type: O/Positive/-- (04/19 1611)   Genetic Screen AFP:  NEG    NIPS:   Diploid XX Antibody:Negative (04/19 1611)  Anatomic [place]  normal female Rubella: 7.39 (04/19 1611)   Varicella: Immune  GTT  28 wk:      RPR: Non Reactive (04/19 1611)   Rhogam  not needed HBsAg: Negative (04/19 1611)   TDaP vaccine               07/28/20        HIV: Non Reactive (04/19 1611)   Flu Shot        07/14/20                        GBS:   Contraception POP Pap: under 21  CBB  no   CS/VBAC n/a   Baby Food Breast   Support Person             Previous Version       Preterm labor symptoms and general obstetric precautions including but not limited to vaginal bleeding, contractions, leaking of fluid and fetal movement were reviewed in detail with the patient. Please refer to After Visit Summary for other counseling recommendations.  Discussed pain control options for labor- benefits and risks associated with epidural, IV meds. Praised for learning much about labor and birth. Plans to breast feed.  Return in about 2 weeks (  around 08/11/2020) for return OB.  Imagene Riches, CNM  07/28/2020 2:00 PM

## 2020-08-11 ENCOUNTER — Other Ambulatory Visit: Payer: Self-pay

## 2020-08-11 ENCOUNTER — Ambulatory Visit (INDEPENDENT_AMBULATORY_CARE_PROVIDER_SITE_OTHER): Payer: Medicaid Other | Admitting: Obstetrics

## 2020-08-11 VITALS — BP 120/60 | Wt 174.0 lb

## 2020-08-11 DIAGNOSIS — Z3403 Encounter for supervision of normal first pregnancy, third trimester: Secondary | ICD-10-CM

## 2020-08-11 DIAGNOSIS — Z3A33 33 weeks gestation of pregnancy: Secondary | ICD-10-CM

## 2020-08-11 LAB — POCT URINALYSIS DIPSTICK OB
Glucose, UA: NEGATIVE
POC,PROTEIN,UA: NEGATIVE

## 2020-08-11 NOTE — Progress Notes (Signed)
  Routine Prenatal Care Visit  Subjective  Vanessa Vincent is a 19 y.o. G1P0000 at [redacted]w[redacted]d being seen today for ongoing prenatal care.  She is currently monitored for the following issues for this low-risk pregnancy and has History of pyelonephritis; Uncontrollable vomiting; Supervision of low-risk first pregnancy; and Anxiety and depression on their problem list.  ----------------------------------------------------------------------------------- Patient reports no complaints.  She has been watching breastfeeding videos, and birthing videos.  .  .   Vanessa Vincent Fluid denies.  ----------------------------------------------------------------------------------- The following portions of the patient's history were reviewed and updated as appropriate: allergies, current medications, past family history, past medical history, past social history, past surgical history and problem list. Problem list updated.  Objective  Blood pressure 120/60, weight 174 lb (78.9 kg), last menstrual period 12/09/2019. Pregravid weight 155 lb (70.3 kg) Total Weight Gain 19 lb (8.618 kg) Urinalysis: Urine Protein    Urine Glucose    Fetal Status:           General:  Alert, oriented and cooperative. Patient is in no acute distress.  Skin: Skin is warm and dry. No rash noted.   Cardiovascular: Normal heart rate noted  Respiratory: Normal respiratory effort, no problems with respiration noted  Abdomen: Soft, gravid, appropriate for gestational age.       Pelvic:  Cervical exam deferred        Extremities: Normal range of motion.     Mental Status: Normal mood and affect. Normal behavior. Normal judgment and thought content.   Assessment   19 y.o. G1P0000 at [redacted]w[redacted]d by  09/27/2020, by Ultrasound presenting for routine prenatal visit  Plan   Pregnancy#1 Problems (from 12/16/19 to present)    Problem Noted Resolved   Supervision of low-risk first pregnancy 02/07/2020 by Dalia Heading, CNM No   Overview Addendum  07/28/2020  1:57 PM by Imagene Riches, [place] Prenatal Labs  Dating  8wk [place] Blood type: O/Positive/-- (04/19 1611)   Genetic Screen AFP:  NEG    NIPS:   Diploid XX Antibody:Negative (04/19 1611)  Anatomic [place]  normal female Rubella: 7.39 (04/19 1611)   Varicella: Immune  GTT  28 wk:      RPR: Non Reactive (04/19 1611)   Rhogam  not needed HBsAg: Negative (04/19 1611)   TDaP vaccine               07/28/20        HIV: Non Reactive (04/19 1611)   Flu Shot        07/14/20                        GBS:   Contraception POP Pap: under 21  CBB  no   CS/VBAC n/a   Baby Food Breast   Support Person             Previous Version       Preterm labor symptoms and general obstetric precautions including but not limited to vaginal bleeding, contractions, leaking of fluid and fetal movement were reviewed in detail with the patient. Please refer to After Visit Summary for other counseling recommendations.  Breastfeeding discussed today. She is preparing well. Hsa the phone app that teaches her about fetal development as well.  Return in about 2 weeks (around 08/25/2020) for return OB.  Imagene Riches, CNM  08/11/2020 11:00 AM

## 2020-08-11 NOTE — Addendum Note (Signed)
Addended by: Cleophas Dunker D on: 08/11/2020 12:25 PM   Modules accepted: Orders

## 2020-08-11 NOTE — Progress Notes (Signed)
No concerns.rj 

## 2020-08-25 ENCOUNTER — Ambulatory Visit (INDEPENDENT_AMBULATORY_CARE_PROVIDER_SITE_OTHER): Payer: Medicaid Other | Admitting: Obstetrics and Gynecology

## 2020-08-25 ENCOUNTER — Encounter: Payer: Self-pay | Admitting: Obstetrics and Gynecology

## 2020-08-25 ENCOUNTER — Other Ambulatory Visit: Payer: Self-pay

## 2020-08-25 ENCOUNTER — Encounter: Payer: Medicaid Other | Admitting: Obstetrics

## 2020-08-25 VITALS — BP 118/70 | Ht 64.0 in | Wt 181.4 lb

## 2020-08-25 DIAGNOSIS — O4700 False labor before 37 completed weeks of gestation, unspecified trimester: Secondary | ICD-10-CM

## 2020-08-25 DIAGNOSIS — Z3A35 35 weeks gestation of pregnancy: Secondary | ICD-10-CM

## 2020-08-25 DIAGNOSIS — Z3403 Encounter for supervision of normal first pregnancy, third trimester: Secondary | ICD-10-CM

## 2020-08-25 NOTE — Patient Instructions (Signed)
Third Trimester of Pregnancy The third trimester is from week 28 through week 40 (months 7 through 9). The third trimester is a time when the unborn baby (fetus) is growing rapidly. At the end of the ninth month, the fetus is about 20 inches in length and weighs 6-10 pounds. Body changes during your third trimester Your body will continue to go through many changes during pregnancy. The changes vary from woman to woman. During the third trimester:  Your weight will continue to increase. You can expect to gain 25-35 pounds (11-16 kg) by the end of the pregnancy.  You may begin to get stretch marks on your hips, abdomen, and breasts.  You may urinate more often because the fetus is moving lower into your pelvis and pressing on your bladder.  You may develop or continue to have heartburn. This is caused by increased hormones that slow down muscles in the digestive tract.  You may develop or continue to have constipation because increased hormones slow digestion and cause the muscles that push waste through your intestines to relax.  You may develop hemorrhoids. These are swollen veins (varicose veins) in the rectum that can itch or be painful.  You may develop swollen, bulging veins (varicose veins) in your legs.  You may have increased body aches in the pelvis, back, or thighs. This is due to weight gain and increased hormones that are relaxing your joints.  You may have changes in your hair. These can include thickening of your hair, rapid growth, and changes in texture. Some women also have hair loss during or after pregnancy, or hair that feels dry or thin. Your hair will most likely return to normal after your baby is born.  Your breasts will continue to grow and they will continue to become tender. A yellow fluid (colostrum) may leak from your breasts. This is the first milk you are producing for your baby.  Your belly button may stick out.  You may notice more swelling in your hands,  face, or ankles.  You may have increased tingling or numbness in your hands, arms, and legs. The skin on your belly may also feel numb.  You may feel short of breath because of your expanding uterus.  You may have more problems sleeping. This can be caused by the size of your belly, increased need to urinate, and an increase in your body's metabolism.  You may notice the fetus "dropping," or moving lower in your abdomen (lightening).  You may have increased vaginal discharge.  You may notice your joints feel loose and you may have pain around your pelvic bone. What to expect at prenatal visits You will have prenatal exams every 2 weeks until week 36. Then you will have weekly prenatal exams. During a routine prenatal visit:  You will be weighed to make sure you and the baby are growing normally.  Your blood pressure will be taken.  Your abdomen will be measured to track your baby's growth.  The fetal heartbeat will be listened to.  Any test results from the previous visit will be discussed.  You may have a cervical check near your due date to see if your cervix has softened or thinned (effaced).  You will be tested for Group B streptococcus. This happens between 35 and 37 weeks. Your health care provider may ask you:  What your birth plan is.  How you are feeling.  If you are feeling the baby move.  If you have had any abnormal   symptoms, such as leaking fluid, bleeding, severe headaches, or abdominal cramping.  If you are using any tobacco products, including cigarettes, chewing tobacco, and electronic cigarettes.  If you have any questions. Other tests or screenings that may be performed during your third trimester include:  Blood tests that check for low iron levels (anemia).  Fetal testing to check the health, activity level, and growth of the fetus. Testing is done if you have certain medical conditions or if there are problems during the pregnancy.  Nonstress test  (NST). This test checks the health of your baby to make sure there are no signs of problems, such as the baby not getting enough oxygen. During this test, a belt is placed around your belly. The baby is made to move, and its heart rate is monitored during movement. What is false labor? False labor is a condition in which you feel small, irregular tightenings of the muscles in the womb (contractions) that usually go away with rest, changing position, or drinking water. These are called Braxton Hicks contractions. Contractions may last for hours, days, or even weeks before true labor sets in. If contractions come at regular intervals, become more frequent, increase in intensity, or become painful, you should see your health care provider. What are the signs of labor?  Abdominal cramps.  Regular contractions that start at 10 minutes apart and become stronger and more frequent with time.  Contractions that start on the top of the uterus and spread down to the lower abdomen and back.  Increased pelvic pressure and dull back pain.  A watery or bloody mucus discharge that comes from the vagina.  Leaking of amniotic fluid. This is also known as your "water breaking." It could be a slow trickle or a gush. Let your health care provider know if it has a color or strange odor. If you have any of these signs, call your health care provider right away, even if it is before your due date. Follow these instructions at home: Medicines  Follow your health care provider's instructions regarding medicine use. Specific medicines may be either safe or unsafe to take during pregnancy.  Take a prenatal vitamin that contains at least 600 micrograms (mcg) of folic acid.  If you develop constipation, try taking a stool softener if your health care provider approves. Eating and drinking   Eat a balanced diet that includes fresh fruits and vegetables, whole grains, good sources of protein such as meat, eggs, or tofu,  and low-fat dairy. Your health care provider will help you determine the amount of weight gain that is right for you.  Avoid raw meat and uncooked cheese. These carry germs that can cause birth defects in the baby.  If you have low calcium intake from food, talk to your health care provider about whether you should take a daily calcium supplement.  Eat four or five small meals rather than three large meals a day.  Limit foods that are high in fat and processed sugars, such as fried and sweet foods.  To prevent constipation: ? Drink enough fluid to keep your urine clear or pale yellow. ? Eat foods that are high in fiber, such as fresh fruits and vegetables, whole grains, and beans. Activity  Exercise only as directed by your health care provider. Most women can continue their usual exercise routine during pregnancy. Try to exercise for 30 minutes at least 5 days a week. Stop exercising if you experience uterine contractions.  Avoid heavy lifting.  Do   not exercise in extreme heat or humidity, or at high altitudes.  Wear low-heel, comfortable shoes.  Practice good posture.  You may continue to have sex unless your health care provider tells you otherwise. Relieving pain and discomfort  Take frequent breaks and rest with your legs elevated if you have leg cramps or low back pain.  Take warm sitz baths to soothe any pain or discomfort caused by hemorrhoids. Use hemorrhoid cream if your health care provider approves.  Wear a good support bra to prevent discomfort from breast tenderness.  If you develop varicose veins: ? Wear support pantyhose or compression stockings as told by your healthcare provider. ? Elevate your feet for 15 minutes, 3-4 times a day. Prenatal care  Write down your questions. Take them to your prenatal visits.  Keep all your prenatal visits as told by your health care provider. This is important. Safety  Wear your seat belt at all times when driving.  Make  a list of emergency phone numbers, including numbers for family, friends, the hospital, and police and fire departments. General instructions  Avoid cat litter boxes and soil used by cats. These carry germs that can cause birth defects in the baby. If you have a cat, ask someone to clean the litter box for you.  Do not travel far distances unless it is absolutely necessary and only with the approval of your health care provider.  Do not use hot tubs, steam rooms, or saunas.  Do not drink alcohol.  Do not use any products that contain nicotine or tobacco, such as cigarettes and e-cigarettes. If you need help quitting, ask your health care provider.  Do not use any medicinal herbs or unprescribed drugs. These chemicals affect the formation and growth of the baby.  Do not douche or use tampons or scented sanitary pads.  Do not cross your legs for long periods of time.  To prepare for the arrival of your baby: ? Take prenatal classes to understand, practice, and ask questions about labor and delivery. ? Make a trial run to the hospital. ? Visit the hospital and tour the maternity area. ? Arrange for maternity or paternity leave through employers. ? Arrange for family and friends to take care of pets while you are in the hospital. ? Purchase a rear-facing car seat and make sure you know how to install it in your car. ? Pack your hospital bag. ? Prepare the baby's nursery. Make sure to remove all pillows and stuffed animals from the baby's crib to prevent suffocation.  Visit your dentist if you have not gone during your pregnancy. Use a soft toothbrush to brush your teeth and be gentle when you floss. Contact a health care provider if:  You are unsure if you are in labor or if your water has broken.  You become dizzy.  You have mild pelvic cramps, pelvic pressure, or nagging pain in your abdominal area.  You have lower back pain.  You have persistent nausea, vomiting, or  diarrhea.  You have an unusual or bad smelling vaginal discharge.  You have pain when you urinate. Get help right away if:  Your water breaks before 37 weeks.  You have regular contractions less than 5 minutes apart before 37 weeks.  You have a fever.  You are leaking fluid from your vagina.  You have spotting or bleeding from your vagina.  You have severe abdominal pain or cramping.  You have rapid weight loss or weight gain.  You have   shortness of breath with chest pain.  You notice sudden or extreme swelling of your face, hands, ankles, feet, or legs.  Your baby makes fewer than 10 movements in 2 hours.  You have severe headaches that do not go away when you take medicine.  You have vision changes. Summary  The third trimester is from week 28 through week 40, months 7 through 9. The third trimester is a time when the unborn baby (fetus) is growing rapidly.  During the third trimester, your discomfort may increase as you and your baby continue to gain weight. You may have abdominal, leg, and back pain, sleeping problems, and an increased need to urinate.  During the third trimester your breasts will keep growing and they will continue to become tender. A yellow fluid (colostrum) may leak from your breasts. This is the first milk you are producing for your baby.  False labor is a condition in which you feel small, irregular tightenings of the muscles in the womb (contractions) that eventually go away. These are called Braxton Hicks contractions. Contractions may last for hours, days, or even weeks before true labor sets in.  Signs of labor can include: abdominal cramps; regular contractions that start at 10 minutes apart and become stronger and more frequent with time; watery or bloody mucus discharge that comes from the vagina; increased pelvic pressure and dull back pain; and leaking of amniotic fluid. This information is not intended to replace advice given to you by your  health care provider. Make sure you discuss any questions you have with your health care provider. Document Revised: 01/28/2019 Document Reviewed: 11/12/2016 Elsevier Patient Education  2020 [place].   Vaginal Delivery  Vaginal delivery means that you give birth by pushing your baby out of your birth canal (vagina). A team of health care providers will help you before, during, and after vaginal delivery. Birth experiences are unique for every woman and every pregnancy, and birth experiences vary depending on where you choose to give birth. What happens when I arrive at the birth center or hospital? Once you are in labor and have been admitted into the hospital or birth center, your health care provider may:  Review your pregnancy history and any concerns that you have.  Insert an IV into one of your veins. This may be used to give you fluids and medicines.  Check your blood pressure, pulse, temperature, and heart rate (vital signs).  Check whether your bag of water (amniotic sac) has broken (ruptured).  Talk with you about your birth plan and discuss pain control options. Monitoring Your health care provider may monitor your contractions (uterine monitoring) and your baby's heart rate (fetal monitoring). You may need to be monitored:  Often, but not continuously (intermittently).  All the time or for long periods at a time (continuously). Continuous monitoring may be needed if: ? You are taking certain medicines, such as medicine to relieve pain or make your contractions stronger. ? You have pregnancy or labor complications. Monitoring may be done by:  Placing a special stethoscope or a handheld monitoring device on your abdomen to check your baby's heartbeat and to check for contractions.  Placing monitors on your abdomen (external monitors) to record your baby's heartbeat and the frequency and length of contractions.  Placing monitors inside your uterus through your vagina  (internal monitors) to record your baby's heartbeat and the frequency, length, and strength of your contractions. Depending on the type of monitor, it may remain in your uterus  or on your baby's head until birth.  Telemetry. This is a type of continuous monitoring that can be done with external or internal monitors. Instead of having to stay in bed, you are able to move around during telemetry. Physical exam Your health care provider may perform frequent physical exams. This may include:  Checking how and where your baby is positioned in your uterus.  Checking your cervix to determine: ? Whether it is thinning out (effacing). ? Whether it is opening up (dilating). What happens during labor and delivery?  Normal labor and delivery is divided into the following three stages: Stage 1  This is the longest stage of labor.  This stage can last for hours or days.  Throughout this stage, you will feel contractions. Contractions generally feel mild, infrequent, and irregular at first. They get stronger, more frequent (about every 2-3 minutes), and more regular as you move through this stage.  This stage ends when your cervix is completely dilated to 4 inches (10 cm) and completely effaced. Stage 2  This stage starts once your cervix is completely effaced and dilated and lasts until the delivery of your baby.  This stage may last from 20 minutes to 2 hours.  This is the stage where you will feel an urge to push your baby out of your vagina.  You may feel stretching and burning pain, especially when the widest part of your baby's head passes through the vaginal opening (crowning).  Once your baby is delivered, the umbilical cord will be clamped and cut. This usually occurs after waiting a period of 1-2 minutes after delivery.  Your baby will be placed on your bare chest (skin-to-skin contact) in an upright position and covered with a warm blanket. Watch your baby for feeding cues, like  rooting or sucking, and help the baby to your breast for his or her first feeding. Stage 3  This stage starts immediately after the birth of your baby and ends after you deliver the placenta.  This stage may take anywhere from 5 to 30 minutes.  After your baby has been delivered, you will feel contractions as your body expels the placenta and your uterus contracts to control bleeding. What can I expect after labor and delivery?  After labor is over, you and your baby will be monitored closely until you are ready to go home to ensure that you are both healthy. Your health care team will teach you how to care for yourself and your baby.  You and your baby will stay in the same room (rooming in) during your hospital stay. This will encourage early bonding and successful breastfeeding.  You may continue to receive fluids and medicines through an IV.  Your uterus will be checked and massaged regularly (fundal massage).  You will have some soreness and pain in your abdomen, vagina, and the area of skin between your vaginal opening and your anus (perineum).  If an incision was made near your vagina (episiotomy) or if you had some vaginal tearing during delivery, cold compresses may be placed on your episiotomy or your tear. This helps to reduce pain and swelling.  You may be given a squirt bottle to use instead of wiping when you go to the bathroom. To use the squirt bottle, follow these steps: ? Before you urinate, fill the squirt bottle with warm water. Do not use hot water. ? After you urinate, while you are sitting on the toilet, use the squirt bottle to rinse the area  around your urethra and vaginal opening. This rinses away any urine and blood. ? Fill the squirt bottle with clean water every time you use the bathroom.  It is normal to have vaginal bleeding after delivery. Wear a sanitary pad for vaginal bleeding and discharge. Summary  Vaginal delivery means that you will give birth by  pushing your baby out of your birth canal (vagina).  Your health care provider may monitor your contractions (uterine monitoring) and your baby's heart rate (fetal monitoring).  Your health care provider may perform a physical exam.  Normal labor and delivery is divided into three stages.  After labor is over, you and your baby will be monitored closely until you are ready to go home. This information is not intended to replace advice given to you by your health care provider. Make sure you discuss any questions you have with your health care provider. Document Revised: 11/11/2017 Document Reviewed: 11/11/2017 Elsevier Patient Education  [place].   Pain Relief During Labor and Delivery Many things can cause pain during labor and delivery, including:  Pressure on bones and ligaments due to the baby moving through the pelvis.  Stretching of tissues due to the baby moving through the birth canal.  Muscle tension due to anxiety or nervousness.  The uterus tightening (contracting) and relaxing to help move the baby. There are many ways to deal with the pain of labor and delivery. They include:  Taking prenatal classes. Taking these classes helps you know what to expect during your baby's birth. What you learn will increase your confidence and decrease your anxiety.  Practicing relaxation techniques or doing relaxing activities, such as: ? Focused breathing. ? Meditation. ? Visualization. ? Aroma therapy. ? Listening to your favorite music. ? Hypnosis.  Taking a warm shower or bath (hydrotherapy). This may: ? Provide comfort and relaxation. ? Lessen your perception of pain. ? Decrease the amount of pain medicine needed. ? Decrease the length of labor.  Getting a massage or counterpressure on your back.  Applying warm packs or ice packs.  Changing positions often, moving around, or using a birthing ball.  Getting: ? Pain medicine through an IV or injection into a  muscle. ? Pain medicine inserted into your spinal column. ? Injections of sterile water just under the skin on your lower back (intradermal injections). ? Laughing gas (nitrous oxide). Discuss your pain control options with your health care provider during your prenatal visits. Explore the options offered by your hospital or birth center. What kinds of medicine are available? There are two kinds of medicines that can be used to relieve pain during labor and delivery:  Analgesics. These medicines decrease pain without causing you to lose feeling or the ability to move your muscles.  Anesthetics. These medicines block feeling in the body and can decrease your ability to move freely. Both of these kinds of medicine can cause minor side effects, such as nausea, trouble concentrating, and sleepiness. They can also decrease the baby's heart rate before birth and affect the baby's breathing rate after birth. For this reason, health care providers are careful about when and how much medicine is given. What are specific medicines and procedures that provide pain relief? Local Anesthetics Local anesthetics are used to numb a small area of the body. They may be used along with another kind of anesthetic or used to numb the nerves of the vagina, cervix, and perineum during the second stage of labor. General Anesthetics General anesthetics cause you  to lose consciousness so you do not feel pain. They are usually only used for an emergency cesarean delivery. General anesthetics are given through an IV tube and a mask. Pudendal Block A pudendal block is a form of local anesthetic. It may be used to relieve the pain associated with pushing or stretching of the perineum at the time of delivery or to further numb the perineum. A pudendal block is done by injecting numbing medicine through the vaginal wall into a nerve in the pelvis. Epidural Analgesia Epidural analgesia is given through a flexible IV catheter that  is inserted into the lower back. Numbing medicine is delivered continuously to the area near your spinal column nerves (epidural space). After having this type of analgesia, you may be able to move your legs but you most likely will not be able to walk. Depending on the amount of medicine given, you may lose all feeling in the lower half of your body, or you may retain some level of sensation, including the urge to push. Epidural analgesia can be used to provide pain relief for a vaginal birth. Spinal Block A spinal block is similar to epidural analgesia, but the medicine is injected into the spinal fluid instead of the epidural space. A spinal block is only given once. It starts to relieve pain quickly, but the pain relief lasts only 1-6 hours. Spinal blocks can be used for cesarean deliveries. Combined Spinal-Epidural (CSE) Block A CSE block combines the effects of a spinal block and epidural analgesia. The spinal block works quickly to block all pain. The epidural analgesia provides continuous pain relief, even after the effects of the spinal block have worn off. This information is not intended to replace advice given to you by your health care provider. Make sure you discuss any questions you have with your health care provider. Document Revised: 09/19/2017 Document Reviewed: 02/28/2016 Elsevier Patient Education  [place].

## 2020-08-25 NOTE — Progress Notes (Signed)
Pt is here for ROB.

## 2020-08-25 NOTE — Progress Notes (Signed)
Routine Prenatal Care Visit  Subjective  Vanessa Vincent is a 19 y.o. G1P0000 at [redacted]w[redacted]d being seen today for ongoing prenatal care.  She is currently monitored for the following issues for this low-risk pregnancy and has History of pyelonephritis; Uncontrollable vomiting; Supervision of low-risk first pregnancy; and Anxiety and depression on their problem list.  ----------------------------------------------------------------------------------- Patient reports occassional braxton hick contractions. had one day with more regular contractions last week.   Contractions: Irregular. Vag. Bleeding: None.  Movement: Present. Denies leaking of fluid.  ----------------------------------------------------------------------------------- The following portions of the patient's history were reviewed and updated as appropriate: allergies, current medications, past family history, past medical history, past social history, past surgical history and problem list. Problem list updated.   Objective  Blood pressure 118/70, height 5\' 4"  (1.626 m), weight 181 lb 6.4 oz (82.3 kg), last menstrual period 12/09/2019. Pregravid weight 155 lb (70.3 kg) Total Weight Gain 26 lb 6.4 oz (12 kg) Urinalysis:      Fetal Status:     Movement: Present     General:  Alert, oriented and cooperative. Patient is in no acute distress.  Skin: Skin is warm and dry. No rash noted.   Cardiovascular: Normal heart rate noted  Respiratory: Normal respiratory effort, no problems with respiration noted  Abdomen: Soft, gravid, appropriate for gestational age. Pain/Pressure: Absent     Pelvic:  Cervical exam deferred        Extremities: Normal range of motion.     Mental Status: Normal mood and affect. Normal behavior. Normal judgment and thought content.     Assessment   19 y.o. G1P0000 at [redacted]w[redacted]d by  09/27/2020, by Ultrasound presenting for routine prenatal visit  Plan   Pregnancy#1 Problems (from 12/16/19 to present)     Problem Noted Resolved   Supervision of low-risk first pregnancy 02/07/2020 by Dalia Heading, CNM No   Overview Addendum 07/28/2020  1:57 PM by Imagene Riches, [place] Prenatal Labs  Dating  8wk [place] Blood type: O/Positive/-- (04/19 1611)   Genetic Screen AFP:  NEG    NIPS:   Diploid XX Antibody:Negative (04/19 1611)  Anatomic [place]  normal female Rubella: 7.39 (04/19 1611)   Varicella: Immune  GTT  28 wk:      RPR: Non Reactive (04/19 1611)   Rhogam  not needed HBsAg: Negative (04/19 1611)   TDaP vaccine               07/28/20        HIV: Non Reactive (04/19 1611)   Flu Shot        07/14/20                        GBS:   Contraception POP Pap: under 21  CBB  no   CS/VBAC n/a   Baby Food Breast   Support Person             Previous Version      patient desired cervical check attempted to check cervix, however patient in severe pain with exam, so exam was stopped, could nto reach cervix.   Will send FFN.  Gestational age appropriate obstetric precautions including but not limited to vaginal bleeding, contractions, leaking of fluid and fetal movement were reviewed in detail with the patient.    Return in about 1 week (around 09/01/2020) for rob in person.  Homero Fellers MD [facility], [facility] 08/25/2020,  11:20 AM

## 2020-08-26 LAB — FETAL FIBRONECTIN: Fetal Fibronectin: NEGATIVE

## 2020-08-27 ENCOUNTER — Observation Stay
Admission: EM | Admit: 2020-08-27 | Discharge: 2020-08-27 | Disposition: A | Discharge status: Home / Self Care | Payer: Medicaid Other | Attending: Obstetrics & Gynecology | Admitting: Obstetrics & Gynecology

## 2020-08-27 ENCOUNTER — Other Ambulatory Visit: Payer: Self-pay

## 2020-08-27 DIAGNOSIS — O26899 Other specified pregnancy related conditions, unspecified trimester: Secondary | ICD-10-CM | POA: Diagnosis present

## 2020-08-27 DIAGNOSIS — Z3A35 35 weeks gestation of pregnancy: Secondary | ICD-10-CM | POA: Diagnosis not present

## 2020-08-27 DIAGNOSIS — R1031 Right lower quadrant pain: Secondary | ICD-10-CM | POA: Insufficient documentation

## 2020-08-27 DIAGNOSIS — R1032 Left lower quadrant pain: Secondary | ICD-10-CM | POA: Insufficient documentation

## 2020-08-27 DIAGNOSIS — O99613 Diseases of the digestive system complicating pregnancy, third trimester: Secondary | ICD-10-CM | POA: Diagnosis not present

## 2020-08-27 DIAGNOSIS — O26893 Other specified pregnancy related conditions, third trimester: Secondary | ICD-10-CM | POA: Diagnosis present

## 2020-08-27 DIAGNOSIS — R1030 Lower abdominal pain, unspecified: Secondary | ICD-10-CM | POA: Diagnosis not present

## 2020-08-27 DIAGNOSIS — K5909 Other constipation: Secondary | ICD-10-CM

## 2020-08-27 LAB — URINALYSIS, COMPLETE (UACMP) WITH MICROSCOPIC
Bilirubin Urine: NEGATIVE
Glucose, UA: NEGATIVE mg/dL
Hgb urine dipstick: NEGATIVE
Ketones, ur: NEGATIVE mg/dL
Leukocytes,Ua: NEGATIVE
Nitrite: NEGATIVE
Protein, ur: NEGATIVE mg/dL
Specific Gravity, Urine: 1.012 (ref 1.005–1.030)
pH: 8 (ref 5.0–8.0)

## 2020-08-27 MED ORDER — DOCUSATE SODIUM 100 MG PO CAPS
100.0000 mg | ORAL_CAPSULE | Freq: Two times a day (BID) | ORAL | Status: DC
  Administered 2020-08-27: 100 mg via ORAL
  Filled 2020-08-27 (×2): qty 1

## 2020-08-27 MED ORDER — DOCUSATE SODIUM 100 MG PO CAPS
100.0000 mg | ORAL_CAPSULE | Freq: Two times a day (BID) | ORAL | 1 refills | Status: DC
Start: 2020-08-27 — End: 2021-07-24

## 2020-08-27 MED ORDER — ACETAMINOPHEN 325 MG PO TABS
650.0000 mg | ORAL_TABLET | ORAL | Status: DC | PRN
  Filled 2020-08-27: qty 2

## 2020-08-27 MED ORDER — ONDANSETRON HCL 4 MG/2ML IJ SOLN: 4.0000 mg | Freq: Four times a day (QID) | INTRAMUSCULAR | Status: DC | PRN

## 2020-08-27 MED ORDER — LIDOCAINE HCL (PF) 1 % IJ SOLN: 30.0000 mL | INTRAMUSCULAR | Status: DC | PRN

## 2020-08-27 NOTE — Final Progress Note (Signed)
Physician Final Progress Note  Patient ID: Vanessa Vincent MRN: [id number] DOB/AGE: March 28, 2001 19 y.o.  Admit date: 08/27/2020 Admitting provider: Gae Dry, MD Discharge date: 08/27/2020  Admission Diagnoses: Principal Problem:   Pregnancy related bilateral lower abdominal pain, antepartum Active Problems:   Chronic constipation   [redacted] weeks gestation of pregnancy  Discharge Diagnoses:  Principal Problem:   Pregnancy related bilateral lower abdominal pain, antepartum Active Problems:   Chronic constipation   [redacted] weeks gestation of pregnancy   Consults: None  Significant Findings/ Diagnostic Studies:  Obstetrics Admission History & Physical   No chief complaint on file.   HPI:  19 y.o. G1P0000 @ [redacted]w[redacted]d (09/27/2020, by Ultrasound). Admitted on 08/27/2020:   Patient Active Problem List   Diagnosis Date Noted  . Pregnancy related bilateral lower abdominal pain, antepartum 08/27/2020  . Chronic constipation   . [redacted] weeks gestation of pregnancy   . Supervision of low-risk first pregnancy 02/07/2020  . Anxiety and depression 02/07/2020  . History of pyelonephritis 05/23/2016  . Uncontrollable vomiting      Presents for lower abdominal pains at 35 weeks, not like ctxs but unsure.  No VB or ROM.  Has h/o chronic constipation and has not had BM in days; no meds at this time for that.  Denies ha, blurry vision, fever, dysuria, CP, SOB, epigastric pain or edema..   Prenatal care at: at [facility]. Pregnancy complicated by none.  ROS: A review of systems was performed and negative, except as stated in the above HPI. PMHx:  Past Medical History:  Diagnosis Date  . Anxiety with depression   . Dehydration 05/09/2015  . 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 Hyponatremia 05/09/2015  . Lyme disease   . Migraine   . Orthodontics    has bracees  . Petechiae 05/09/2015  . Pneumonia 05/08/2015  . Pyelonephritis   . Reflux    as infant  . Thrombocytopenia ([place])     PSHx:  Past Surgical History:  Procedure Laterality Date  . SEPTOPLASTY N/A 07/23/2016   Procedure: SEPTOPLASTY;  Surgeon: Clyde Canterbury, MD;  Location: [facility];  Service: ENT;  Laterality: N/A;   Medications:  Medications Prior to Admission  Medication Sig Dispense Refill Last Dose  . ondansetron (ZOFRAN ODT) 4 MG disintegrating tablet Take 1 tablet (4 mg total) by mouth every 6 (six) hours as needed for nausea. 120 tablet 6 Unknown at Unknown time  . Prenatal Vit-Fe Fumarate-FA (MULTIVITAMIN-PRENATAL) 27-0.8 MG TABS tablet Take 1 tablet by mouth daily at 12 noon.   Unknown at Unknown time   Allergies: has No Known Allergies. OBHx:  OB History  Gravida Para Term Preterm AB Living  1 0 0 0 0 0  SAB TAB Ectopic Multiple Live Births  0 0 0 0 0    # Outcome Date GA Lbr Len/2nd Weight Sex Delivery Anes PTL Lv  1 Current            HWT:UUEKCMKL/KJZPHXTAVWPV except as detailed in HPI.Marland Kitchen  No family history of birth defects. Soc Hx: Alcohol: none and Recreational drug use: none  Objective:   Vitals:   08/27/20 1040  BP: 131/85  Pulse: 98  Resp: 18  Temp: 98.5 F (36.9 C)   Constitutional: Well nourished, well developed female in no acute distress.  HEENT: normal Skin: Warm and dry.  Cardiovascular:Regular rate and rhythm.   Extremity: trace to 1+ bilateral pedal edema Respiratory: Clear to auscultation bilateral. Normal respiratory effort Abdomen: gravid, ND, FHT present,  mild tenderness on exam Back: no CVAT Neuro: DTRs 2+, Cranial nerves grossly intact Psych: Alert and Oriented x3. No memory deficits. Normal mood and affect.  MS: normal gait, normal bilateral lower extremity ROM/strength/stability.  Pelvic exam: is not limited by body habitus EGBUS: within normal limits Vagina: within normal limits and with normal mucosa Cervix: closed and thick and high Uterus: No contractions observed for 30 minutes.  Adnexa: not evaluated  Assessment & Plan:   19  y.o. G1P0000 @ [redacted]w[redacted]d, Admitted on 08/27/2020:Principal Problem:   Pregnancy related bilateral lower abdominal pain, antepartum Active Problems:   Chronic constipation   [redacted] weeks gestation of pregnancy    Procedures: A NST procedure was performed with FHR monitoring and a normal baseline established, appropriate time of 20-40 minutes of evaluation, and accels >2 seen w 15x15 characteristics.  Results show a REACTIVE NST.   UA NEG  Discharge Condition: good  Disposition: Discharge disposition: 01-Home or Self Care       Diet: Regular diet  Discharge Activity: Activity as tolerated  Discharge Instructions    Call MD for:   Complete by: As directed    Worsening contractions or pain; leakage of fluid; bleeding.   Diet - low sodium heart healthy   Complete by: As directed    Increase activity slowly   Complete by: As directed      Allergies as of 08/27/2020   No Known Allergies     Medication List    TAKE these medications   docusate sodium 100 MG capsule Commonly known as: COLACE Take 1 capsule (100 mg total) by mouth 2 (two) times daily.   multivitamin-prenatal 27-0.8 MG Tabs tablet Take 1 tablet by mouth daily at 12 noon.   ondansetron 4 MG disintegrating tablet Commonly known as: Zofran ODT Take 1 tablet (4 mg total) by mouth every 6 (six) hours as needed for nausea.       [place]. Go in 1 week(s).   Contact information: [address] [place] [address] [phone]               Total time spent taking care of this patient: 20 minutes  Signed: Hoyt Koch 08/27/2020, 11:40 AM

## 2020-08-27 NOTE — OB Triage Note (Addendum)
Pt c/o pressure in her lower abdomen. Some cramping that was constant through the night. She does have issues with constipation, she can't remember the last time she had a bowel movement. Denies vaginal bleeding or LOF. Feels baby move just not as much the last couple days. Reports some braxton hicks the past few weeks, not regular and talks through ctx.

## 2020-08-27 NOTE — Discharge Instructions (Signed)
Braxton Hicks Contractions °Contractions of the uterus can occur throughout pregnancy, but they are not always a sign that you are in labor. You may have practice contractions called Braxton Hicks contractions. These false labor contractions are sometimes confused with true labor. °What are Braxton Hicks contractions? °Braxton Hicks contractions are tightening movements that occur in the muscles of the uterus before labor. Unlike true labor contractions, these contractions do not result in opening (dilation) and thinning of the cervix. Toward the end of pregnancy (32-34 weeks), Braxton Hicks contractions can happen more often and may become stronger. These contractions are sometimes difficult to tell apart from true labor because they can be very uncomfortable. You should not feel embarrassed if you go to the hospital with false labor. °Sometimes, the only way to tell if you are in true labor is for your health care provider to look for changes in the cervix. The health care provider will do a physical exam and may monitor your contractions. If you are not in true labor, the exam should show that your cervix is not dilating and your water has not broken. °If there are no other health problems associated with your pregnancy, it is completely safe for you to be sent home with false labor. You may continue to have Braxton Hicks contractions until you go into true labor. °How to tell the difference between true labor and false labor °True labor °· Contractions last 30-70 seconds. °· Contractions become very regular. °· Discomfort is usually felt in the top of the uterus, and it spreads to the lower abdomen and low back. °· Contractions do not go away with walking. °· Contractions usually become more intense and increase in frequency. °· The cervix dilates and gets thinner. °False labor °· Contractions are usually shorter and not as strong as true labor contractions. °· Contractions are usually irregular. °· Contractions  are often felt in the front of the lower abdomen and in the groin. °· Contractions may go away when you walk around or change positions while lying down. °· Contractions get weaker and are shorter-lasting as time goes on. °· The cervix usually does not dilate or become thin. °Follow these instructions at home: ° °· Take over-the-counter and prescription medicines only as told by your health care provider. °· Keep up with your usual exercises and follow other instructions from your health care provider. °· Eat and drink lightly if you think you are going into labor. °· If Braxton Hicks contractions are making you uncomfortable: °? Change your position from lying down or resting to walking, or change from walking to resting. °? Sit and rest in a tub of warm water. °? Drink enough fluid to keep your urine pale yellow. Dehydration may cause these contractions. °? Do slow and deep breathing several times an hour. °· Keep all follow-up prenatal visits as told by your health care provider. This is important. °Contact a health care provider if: °· You have a fever. °· You have continuous pain in your abdomen. °Get help right away if: °· Your contractions become stronger, more regular, and closer together. °· You have fluid leaking or gushing from your vagina. °· You pass blood-tinged mucus (bloody show). °· You have bleeding from your vagina. °· You have low back pain that you never had before. °· You feel your baby’s head pushing down and causing pelvic pressure. °· Your baby is not moving inside you as much as it used to. °Summary °· Contractions that occur before labor are   called Braxton Hicks contractions, false labor, or practice contractions. °· Braxton Hicks contractions are usually shorter, weaker, farther apart, and less regular than true labor contractions. True labor contractions usually become progressively stronger and regular, and they become more frequent. °· Manage discomfort from Braxton Hicks contractions  by changing position, resting in a warm bath, drinking plenty of water, or practicing deep breathing. °This information is not intended to replace advice given to you by your health care provider. Make sure you discuss any questions you have with your health care provider. °Document Revised: 09/19/2017 Document Reviewed: 02/20/2017 °Elsevier Patient Education © 2020 [employer] ° °

## 2020-08-27 NOTE — Discharge Summary (Signed)
See FPN

## 2020-08-29 ENCOUNTER — Ambulatory Visit (INDEPENDENT_AMBULATORY_CARE_PROVIDER_SITE_OTHER): Payer: Medicaid Other | Admitting: Obstetrics

## 2020-08-29 ENCOUNTER — Other Ambulatory Visit: Payer: Self-pay

## 2020-08-29 VITALS — BP 118/72 | Ht 64.0 in | Wt 182.2 lb

## 2020-08-29 DIAGNOSIS — Z3403 Encounter for supervision of normal first pregnancy, third trimester: Secondary | ICD-10-CM

## 2020-08-29 DIAGNOSIS — Z3A35 35 weeks gestation of pregnancy: Secondary | ICD-10-CM

## 2020-08-29 NOTE — Progress Notes (Signed)
Routine Prenatal Care Visit  Subjective  Vanessa Vincent is a 19 y.o. G1P0000 at [redacted]w[redacted]d being seen today for ongoing prenatal care.  She is currently monitored for the following issues for this low-risk pregnancy and has History of pyelonephritis; Uncontrollable vomiting; Supervision of low-risk first pregnancy; Anxiety and depression; Pregnancy related bilateral lower abdominal pain, antepartum; Chronic constipation; and [redacted] weeks gestation of pregnancy on their problem list.  ----------------------------------------------------------------------------------- Patient reports no complaints.   Contractions: Not present. Vag. Bleeding: None.  Movement: Present. Leaking Fluid denies.  ----------------------------------------------------------------------------------- The following portions of the patient's history were reviewed and updated as appropriate: allergies, current medications, past family history, past medical history, past social history, past surgical history and problem list. Problem list updated.  Objective  Blood pressure 118/72, height 5\' 4"  (1.626 m), weight 182 lb 3.2 oz (82.6 kg), last menstrual period 12/09/2019. Pregravid weight 155 lb (70.3 kg) Total Weight Gain 27 lb 3.2 oz (12.3 kg) Urinalysis: Urine Protein    Urine Glucose    Fetal Status:     Movement: Present     General:  Alert, oriented and cooperative. Patient is in no acute distress.  Skin: Skin is warm and dry. No rash noted.   Cardiovascular: Normal heart rate noted  Respiratory: Normal respiratory effort, no problems with respiration noted  Abdomen: Soft, gravid, appropriate for gestational age. Pain/Pressure: Absent     Pelvic:  Cervical exam deferred        Extremities: Normal range of motion.  Edema: None  Mental Status: Normal mood and affect. Normal behavior. Normal judgment and thought content.   Assessment   19 y.o. G1P0000 at [redacted]w[redacted]d by  09/27/2020, by Ultrasound presenting for routine prenatal  visit  Plan   Pregnancy#1 Problems (from 12/16/19 to present)    Problem Noted Resolved   Supervision of low-risk first pregnancy 02/07/2020 by Dalia Heading, CNM No   Overview Addendum 08/25/2020 11:34 AM by Homero Fellers, MD      Clinic [facility] Prenatal Labs  Dating  8wk [place] Blood type: O/Positive/-- (04/19 1611)   Genetic Screen AFP:  NEG    NIPS:   Diploid XX Antibody:Negative (04/19 1611)  Anatomic [place]  normal female Rubella: 7.39 (04/19 1611)   Varicella: Immune  GTT  28 wk:   89   RPR: Non Reactive (04/19 1611)   Rhogam  not needed HBsAg: Negative (04/19 1611)   TDaP vaccine               07/28/20        HIV: Non Reactive (04/19 1611)   Flu Shot        07/14/20                        GBS:   Contraception POP Pap: under 21  CBB  no   CS/VBAC n/a   Baby Food Breast   Support Person             Previous Version       Term labor symptoms and general obstetric precautions including but not limited to vaginal bleeding, contractions, leaking of fluid and fetal movement were reviewed in detail with the patient. Please refer to After Visit Summary for other counseling recommendations.  Discussed the GBS testing at next visit. Her support people in labor will be her mother and Christy Sartorius , the FOB. No cervical exam today- discussed waiting until 38 weeks as it was uncomfortable for her last visit.  Return in about 1 week (around 09/05/2020) for return OB.  Imagene Riches, CNM  08/29/2020 10:42 AM

## 2020-08-29 NOTE — Progress Notes (Signed)
Pt here for a ROB.

## 2020-09-06 ENCOUNTER — Ambulatory Visit (INDEPENDENT_AMBULATORY_CARE_PROVIDER_SITE_OTHER): Payer: Medicaid Other | Admitting: Obstetrics and Gynecology

## 2020-09-06 ENCOUNTER — Other Ambulatory Visit: Payer: Self-pay

## 2020-09-06 ENCOUNTER — Other Ambulatory Visit (HOSPITAL_COMMUNITY)
Admission: RE | Admit: 2020-09-06 | Discharge: 2020-09-06 | Disposition: A | Discharge status: Home / Self Care | Payer: Medicaid Other | Source: Ambulatory Visit | Attending: Obstetrics and Gynecology | Admitting: Obstetrics and Gynecology

## 2020-09-06 VITALS — BP 114/72 | Wt 181.0 lb

## 2020-09-06 DIAGNOSIS — Z113 Encounter for screening for infections with a predominantly sexual mode of transmission: Secondary | ICD-10-CM

## 2020-09-06 DIAGNOSIS — Z3A37 37 weeks gestation of pregnancy: Secondary | ICD-10-CM | POA: Insufficient documentation

## 2020-09-06 DIAGNOSIS — Z3403 Encounter for supervision of normal first pregnancy, third trimester: Secondary | ICD-10-CM | POA: Insufficient documentation

## 2020-09-06 DIAGNOSIS — Z3685 Encounter for antenatal screening for Streptococcus B: Secondary | ICD-10-CM

## 2020-09-06 LAB — POCT URINALYSIS DIPSTICK OB: Glucose, UA: NEGATIVE

## 2020-09-06 NOTE — Progress Notes (Signed)
ROB - No concerns. RM 3

## 2020-09-06 NOTE — Progress Notes (Signed)
    Routine Prenatal Care Visit  Subjective  Vanessa Vincent is a 19 y.o. G1P0000 at [redacted]w[redacted]d being seen today for ongoing prenatal care.  She is currently monitored for the following issues for this low-risk pregnancy and has History of pyelonephritis; Uncontrollable vomiting; Supervision of low-risk first pregnancy; Anxiety and depression; Pregnancy related bilateral lower abdominal pain, antepartum; and Chronic constipation on their problem list.  ----------------------------------------------------------------------------------- Patient reports no complaints.   Contractions: Not present. Vag. Bleeding: None.  Movement: Present. Denies leaking of fluid.  ----------------------------------------------------------------------------------- The following portions of the patient's history were reviewed and updated as appropriate: allergies, current medications, past family history, past medical history, past social history, past surgical history and problem list. Problem list updated.   Objective  Blood pressure 114/72, weight 181 lb (82.1 kg), last menstrual period 12/09/2019. Pregravid weight 155 lb (70.3 kg) Total Weight Gain 26 lb (11.8 kg) Urinalysis:      Fetal Status: Fetal Heart Rate (bpm): 145 Fundal Height: 36 cm Movement: Present  Presentation: Vertex  General:  Alert, oriented and cooperative. Patient is in no acute distress.  Skin: Skin is warm and dry. No rash noted.   Cardiovascular: Normal heart rate noted  Respiratory: Normal respiratory effort, no problems with respiration noted  Abdomen: Soft, gravid, appropriate for gestational age. Pain/Pressure: Present     Pelvic:  Cervical exam performed Dilation: Closed Effacement (%): 50 Station: -3 very narrow pubic arch  Extremities: Normal range of motion.     ental Status: Normal mood and affect. Normal behavior. Normal judgment and thought content.     Assessment   19 y.o. G1P0000 at [redacted]w[redacted]d by  09/27/2020, by Ultrasound  presenting for routine prenatal visit  Plan   Pregnancy#1 Problems (from 12/16/19 to present)    Problem Noted Resolved   Supervision of low-risk first pregnancy 02/07/2020 by Dalia Heading, CNM No   Overview Addendum 08/25/2020 11:34 AM by Homero Fellers, MD      Clinic [facility] Prenatal Labs  Dating  8wk [place] Blood type: O/Positive/-- (04/19 1611)   Genetic Screen AFP:  NEG    NIPS:   Diploid XX Antibody:Negative (04/19 1611)  Anatomic [place]  normal female Rubella: 7.39 (04/19 1611)   Varicella: Immune  GTT  28 wk:   89   RPR: Non Reactive (04/19 1611)   Rhogam  not needed HBsAg: Negative (04/19 1611)   TDaP vaccine               07/28/20        HIV: Non Reactive (04/19 1611)   Flu Shot        07/14/20                        GBS:   Contraception POP Pap: under 21  CBB  no   CS/VBAC n/a   Baby Food Breast   Support Person             Previous Version       Gestational age appropriate obstetric precautions including but not limited to vaginal bleeding, contractions, leaking of fluid and fetal movement were reviewed in detail with the patient.    - GBS aptima today  Return in about 1 week (around 09/13/2020) for ROB.  Malachy Mood, MD, [facility], [facility] 09/06/2020, 12:17 PM

## 2020-09-07 LAB — CERVICOVAGINAL ANCILLARY ONLY
Chlamydia: NEGATIVE
Comment: NEGATIVE
Comment: NORMAL
Neisseria Gonorrhea: NEGATIVE

## 2020-09-08 LAB — STREP GP B NAA: Strep Gp B NAA: NEGATIVE

## 2020-09-12 ENCOUNTER — Encounter: Payer: Self-pay | Admitting: Obstetrics

## 2020-09-12 ENCOUNTER — Other Ambulatory Visit: Payer: Self-pay

## 2020-09-12 ENCOUNTER — Ambulatory Visit (INDEPENDENT_AMBULATORY_CARE_PROVIDER_SITE_OTHER): Payer: Medicaid Other | Admitting: Obstetrics

## 2020-09-12 VITALS — BP 116/60 | Wt 186.0 lb

## 2020-09-12 DIAGNOSIS — Z3403 Encounter for supervision of normal first pregnancy, third trimester: Secondary | ICD-10-CM

## 2020-09-12 DIAGNOSIS — Z3A37 37 weeks gestation of pregnancy: Secondary | ICD-10-CM

## 2020-09-12 LAB — POCT URINALYSIS DIPSTICK OB
Glucose, UA: NEGATIVE
POC,PROTEIN,UA: NEGATIVE

## 2020-09-12 NOTE — Patient Instructions (Addendum)
Third Trimester of Pregnancy The third trimester is from week 28 through week 40 (months 7 through 9). The third trimester is a time when the unborn baby (fetus) is growing rapidly. At the end of the ninth month, the fetus is about 20 inches in length and weighs 6-10 pounds. Body changes during your third trimester Your body will continue to go through many changes during pregnancy. The changes vary from woman to woman. During the third trimester:  Your weight will continue to increase. You can expect to gain 25-35 pounds (11-16 kg) by the end of the pregnancy.  You may begin to get stretch marks on your hips, abdomen, and breasts.  You may urinate more often because the fetus is moving lower into your pelvis and pressing on your bladder.  You may develop or continue to have heartburn. This is caused by increased hormones that slow down muscles in the digestive tract.  You may develop or continue to have constipation because increased hormones slow digestion and cause the muscles that push waste through your intestines to relax.  You may develop hemorrhoids. These are swollen veins (varicose veins) in the rectum that can itch or be painful.  You may develop swollen, bulging veins (varicose veins) in your legs.  You may have increased body aches in the pelvis, back, or thighs. This is due to weight gain and increased hormones that are relaxing your joints.  You may have changes in your hair. These can include thickening of your hair, rapid growth, and changes in texture. Some women also have hair loss during or after pregnancy, or hair that feels dry or thin. Your hair will most likely return to normal after your baby is born.  Your breasts will continue to grow and they will continue to become tender. A yellow fluid (colostrum) may leak from your breasts. This is the first milk you are producing for your baby.  Your belly button may stick out.  You may notice more swelling in your hands,  face, or ankles.  You may have increased tingling or numbness in your hands, arms, and legs. The skin on your belly may also feel numb.  You may feel short of breath because of your expanding uterus.  You may have more problems sleeping. This can be caused by the size of your belly, increased need to urinate, and an increase in your body's metabolism.  You may notice the fetus "dropping," or moving lower in your abdomen (lightening).  You may have increased vaginal discharge.  You may notice your joints feel loose and you may have pain around your pelvic bone. What to expect at prenatal visits You will have prenatal exams every 2 weeks until week 36. Then you will have weekly prenatal exams. During a routine prenatal visit:  You will be weighed to make sure you and the baby are growing normally.  Your blood pressure will be taken.  Your abdomen will be measured to track your baby's growth.  The fetal heartbeat will be listened to.  Any test results from the previous visit will be discussed.  You may have a cervical check near your due date to see if your cervix has softened or thinned (effaced).  You will be tested for Group B streptococcus. This happens between 35 and 37 weeks. Your health care provider may ask you:  What your birth plan is.  How you are feeling.  If you are feeling the baby move.  If you have had any abnormal   symptoms, such as leaking fluid, bleeding, severe headaches, or abdominal cramping.  If you are using any tobacco products, including cigarettes, chewing tobacco, and electronic cigarettes.  If you have any questions. Other tests or screenings that may be performed during your third trimester include:  Blood tests that check for low iron levels (anemia).  Fetal testing to check the health, activity level, and growth of the fetus. Testing is done if you have certain medical conditions or if there are problems during the pregnancy.  Nonstress test  (NST). This test checks the health of your baby to make sure there are no signs of problems, such as the baby not getting enough oxygen. During this test, a belt is placed around your belly. The baby is made to move, and its heart rate is monitored during movement. What is false labor? False labor is a condition in which you feel small, irregular tightenings of the muscles in the womb (contractions) that usually go away with rest, changing position, or drinking water. These are called Braxton Hicks contractions. Contractions may last for hours, days, or even weeks before true labor sets in. If contractions come at regular intervals, become more frequent, increase in intensity, or become painful, you should see your health care provider. What are the signs of labor?  Abdominal cramps.  Regular contractions that start at 10 minutes apart and become stronger and more frequent with time.  Contractions that start on the top of the uterus and spread down to the lower abdomen and back.  Increased pelvic pressure and dull back pain.  A watery or bloody mucus discharge that comes from the vagina.  Leaking of amniotic fluid. This is also known as your "water breaking." It could be a slow trickle or a gush. Let your health care provider know if it has a color or strange odor. If you have any of these signs, call your health care provider right away, even if it is before your due date. Follow these instructions at home: Medicines  Follow your health care provider's instructions regarding medicine use. Specific medicines may be either safe or unsafe to take during pregnancy.  Take a prenatal vitamin that contains at least 600 micrograms (mcg) of folic acid.  If you develop constipation, try taking a stool softener if your health care provider approves. Eating and drinking   Eat a balanced diet that includes fresh fruits and vegetables, whole grains, good sources of protein such as meat, eggs, or tofu,  and low-fat dairy. Your health care provider will help you determine the amount of weight gain that is right for you.  Avoid raw meat and uncooked cheese. These carry germs that can cause birth defects in the baby.  If you have low calcium intake from food, talk to your health care provider about whether you should take a daily calcium supplement.  Eat four or five small meals rather than three large meals a day.  Limit foods that are high in fat and processed sugars, such as fried and sweet foods.  To prevent constipation: ? Drink enough fluid to keep your urine clear or pale yellow. ? Eat foods that are high in fiber, such as fresh fruits and vegetables, whole grains, and beans. Activity  Exercise only as directed by your health care provider. Most women can continue their usual exercise routine during pregnancy. Try to exercise for 30 minutes at least 5 days a week. Stop exercising if you experience uterine contractions.  Avoid heavy lifting.  Do   not exercise in extreme heat or humidity, or at high altitudes.  Wear low-heel, comfortable shoes.  Practice good posture.  You may continue to have sex unless your health care provider tells you otherwise. Relieving pain and discomfort  Take frequent breaks and rest with your legs elevated if you have leg cramps or low back pain.  Take warm sitz baths to soothe any pain or discomfort caused by hemorrhoids. Use hemorrhoid cream if your health care provider approves.  Wear a good support bra to prevent discomfort from breast tenderness.  If you develop varicose veins: ? Wear support pantyhose or compression stockings as told by your healthcare provider. ? Elevate your feet for 15 minutes, 3-4 times a day. Prenatal care  Write down your questions. Take them to your prenatal visits.  Keep all your prenatal visits as told by your health care provider. This is important. Safety  Wear your seat belt at all times when driving.  Make  a list of emergency phone numbers, including numbers for family, friends, the hospital, and police and fire departments. General instructions  Avoid cat litter boxes and soil used by cats. These carry germs that can cause birth defects in the baby. If you have a cat, ask someone to clean the litter box for you.  Do not travel far distances unless it is absolutely necessary and only with the approval of your health care provider.  Do not use hot tubs, steam rooms, or saunas.  Do not drink alcohol.  Do not use any products that contain nicotine or tobacco, such as cigarettes and e-cigarettes. If you need help quitting, ask your health care provider.  Do not use any medicinal herbs or unprescribed drugs. These chemicals affect the formation and growth of the baby.  Do not douche or use tampons or scented sanitary pads.  Do not cross your legs for long periods of time.  To prepare for the arrival of your baby: ? Take prenatal classes to understand, practice, and ask questions about labor and delivery. ? Make a trial run to the hospital. ? Visit the hospital and tour the maternity area. ? Arrange for maternity or paternity leave through employers. ? Arrange for family and friends to take care of pets while you are in the hospital. ? Purchase a rear-facing car seat and make sure you know how to install it in your car. ? Pack your hospital bag. ? Prepare the baby's nursery. Make sure to remove all pillows and stuffed animals from the baby's crib to prevent suffocation.  Visit your dentist if you have not gone during your pregnancy. Use a soft toothbrush to brush your teeth and be gentle when you floss. Contact a health care provider if:  You are unsure if you are in labor or if your water has broken.  You become dizzy.  You have mild pelvic cramps, pelvic pressure, or nagging pain in your abdominal area.  You have lower back pain.  You have persistent nausea, vomiting, or  diarrhea.  You have an unusual or bad smelling vaginal discharge.  You have pain when you urinate. Get help right away if:  Your water breaks before 37 weeks.  You have regular contractions less than 5 minutes apart before 37 weeks.  You have a fever.  You are leaking fluid from your vagina.  You have spotting or bleeding from your vagina.  You have severe abdominal pain or cramping.  You have rapid weight loss or weight gain.  You have   shortness of breath with chest pain.  You notice sudden or extreme swelling of your face, hands, ankles, feet, or legs.  Your baby makes fewer than 10 movements in 2 hours.  You have severe headaches that do not go away when you take medicine.  You have vision changes. Summary  The third trimester is from week 28 through week 40, months 7 through 9. The third trimester is a time when the unborn baby (fetus) is growing rapidly.  During the third trimester, your discomfort may increase as you and your baby continue to gain weight. You may have abdominal, leg, and back pain, sleeping problems, and an increased need to urinate.  During the third trimester your breasts will keep growing and they will continue to become tender. A yellow fluid (colostrum) may leak from your breasts. This is the first milk you are producing for your baby.  False labor is a condition in which you feel small, irregular tightenings of the muscles in the womb (contractions) that eventually go away. These are called Braxton Hicks contractions. Contractions may last for hours, days, or even weeks before true labor sets in.  Signs of labor can include: abdominal cramps; regular contractions that start at 10 minutes apart and become stronger and more frequent with time; watery or bloody mucus discharge that comes from the vagina; increased pelvic pressure and dull back pain; and leaking of amniotic fluid. This information is not intended to replace advice given to you by your  health care provider. Make sure you discuss any questions you have with your health care provider. Document Revised: 01/28/2019 Document Reviewed: 11/12/2016 Elsevier Patient Education  [place].   Exclusive Breastfeeding  Exclusive breastfeeding means feeding a baby with breast milk only. It is recommended that babies be exclusively breastfed for 6 months. Breastfeeding may continue until a baby is 1 year or older, if wanted by both mother and child. Exclusive breastfeeding for at least 6 months has many benefits for both the mother and the baby. What are the benefits of exclusive breastfeeding?  Exclusive breastfeeding helps your baby grow and develop normally. One reason for this is that breast milk has all the nutrients that a baby needs, when the baby needs them.  Breast milk helps develop your baby's immune system by providing proteins called antibodies that help fight off germs.  Exclusive breastfeeding may lower your baby's risk for: ? Stomach and intestinal problems. ? Allergies. ? Ear infections. ? Respiratory infections. ? Obesity. ? Diabetes. ? Sudden infant death syndrome (SIDS).  Breastfeeding helps improve your recovery from giving birth by: ? Reducing how much blood you lose after delivery. ? Speeding up how quickly your uterus heals. ? Reducing your risk of postpartum depression. ? Increasing the time before your routine menstrual periods return (lactational amenorrhea), which can help to delay pregnancy if you are not using birth control. What are some tips for exclusive breastfeeding?  Start breastfeeding within your baby's first hour of life.  Do not give your baby infant formula, water, or solid food before your baby is 67 months old, unless told by your health care provider.  Feed your baby on-demand. This means feeding anytime your child expresses signs of hunger. This can help maintain your milk supply. Signs of hunger include: ? Moving  restlessly. ? Rooting. This is when the baby looks like he or she is sucking without anything in the mouth. ? Bringing hands to the mouth. ? Crying.  Avoid using bottles in the  first several weeks.  Do not use pacifiers.  If you must bottle feed: ? Continue to offer your baby breast milk by using a breast pump to maintain your milk supply. ? Pump after feedings and store extra breast milk. ? Offer only breast milk in a bottle. What happens if I start supplementing feedings? If you work outside the home, it may be difficult to continue exclusive breastfeeding. However, you can make sure your baby continues to receive only breast milk by pumping and providing breast milk through bottle feeding. Sometimes it is necessary to supplement feedings. If your baby was born prematurely or has vitamin or mineral deficiencies, your health care provider may recommend giving your baby rehydration liquids or vitamin and mineral supplements with breast milk. If you start supplementing feedings, your baby will drink less breast milk and your body will respond by making less breast milk. If you choose to supplement feedings but would like to maintain your milk supply so you can breastfeed your baby exclusively later on, you can pump your breast milk and give your baby your breast milk by bottle. Where to find support  Health care providers and lactation specialists. They can help by: ? Giving you educational materials. ? Giving you information about where you can get supplies such as breast pumps and nursing bras. ? Providing you with counseling if you need emotional support. ? Sharing feeding basics with you, such as effective positions for breastfeeding. ? Troubleshooting feeding challenges.  Your peers. Your friends, family, and other women can help by: ? Sharing their experiences and success stories. ? Giving you new ideas. ? Encouraging you to keep breastfeeding even when it feels  difficult.  Educational programs about breastfeeding. These programs can help you prepare for breastfeeding before your baby is born. Educational programs include: ? Classes. ? Print handouts. ? Videos. ? Telephone support. ? One-on-one instruction. Summary  Exclusive breastfeeding means feeding a baby with breast milk only.  Exclusive breastfeeding provides many benefits for both you and your baby.  Exclusive breastfeeding for the first 6 months of your baby's life is recommended.  You can find support for breastfeeding through your health care provider, friends and family, and educational programs. This information is not intended to replace advice given to you by your health care provider. Make sure you discuss any questions you have with your health care provider. Document Revised: 01/28/2019 Document Reviewed: 08/26/2016 Elsevier Patient Education  [place].

## 2020-09-12 NOTE — Progress Notes (Signed)
° ° °  Routine Prenatal Care Visit  Subjective  Vanessa Vincent is a 19 y.o. G1P0000 at [redacted]w[redacted]d being seen today for ongoing prenatal care.  She is currently monitored for the following issues for this low-risk pregnancy and has History of pyelonephritis; Uncontrollable vomiting; Supervision of low-risk first pregnancy, third trimester; Anxiety and depression; Pregnancy related bilateral lower abdominal pain, antepartum; and Chronic constipation on their problem list.  ----------------------------------------------------------------------------------- Patient reports no complaints.   Contractions: Not present. Vag. Bleeding: None.  Movement: Present. Denies leaking of fluid.  ----------------------------------------------------------------------------------- The following portions of the patient's history were reviewed and updated as appropriate: allergies, current medications, past family history, past medical history, past social history, past surgical history and problem list. Problem list updated.   Objective  Blood pressure 116/60, weight 186 lb (84.4 kg), last menstrual period 12/09/2019. Pregravid weight 155 lb (70.3 kg) Total Weight Gain 31 lb (14.1 kg) Urinalysis:      Fetal Status: Fetal Heart Rate (bpm): 140 Fundal Height: 38 cm Movement: Present  Presentation: Vertex  General:  Alert, oriented and cooperative. Patient is in no acute distress.  Skin: Skin is warm and dry. No rash noted.   Cardiovascular: Normal heart rate noted  Respiratory: Normal respiratory effort, no problems with respiration noted  Abdomen: Soft, gravid, appropriate for gestational age. Pain/Pressure: Absent     Pelvic:  Cervical exam performed Dilation: Fingertip Effacement (%): 50 Station: -3  Extremities: Normal range of motion.     ental Status: Normal mood and affect. Normal behavior. Normal judgment and thought content.     Assessment   19 y.o. G1P0000 at [redacted]w[redacted]d by  09/27/2020, by Ultrasound presenting  for routine prenatal visit  Plan   Pregnancy#1 Problems (from 12/16/19 to present)    Problem Noted Resolved   Supervision of low-risk first pregnancy, third trimester 02/07/2020 by Dalia Heading, CNM No   Overview Addendum 09/12/2020  8:57 AM by Orlie Pollen, [place] Prenatal Labs  Dating  8wk [place] Blood type: O/Positive/-- (04/19 1611)   Genetic Screen AFP:  NEG    NIPS:   Diploid XX Antibody:Negative (04/19 1611)  Anatomic [place]  normal female Rubella: 7.39 (04/19 1611)   Varicella: Immune  GTT  28 wk:   89   RPR: Non Reactive (09/10 1009)   Rhogam  not needed HBsAg: Negative (04/19 1611)   TDaP vaccine               07/28/20        HIV: Non Reactive (09/10 1009)   Flu Shot        07/14/20                        [id number]/-- (11/17 1215)  Contraception POP Pap: under 21  CBB  no   CS/VBAC n/a   Baby Food Breast   Support Person             Previous Version       Reviewed GBS negative status. Reviewed labor precautions. Discussed concern regarding pelvic shape/size. Reassurance provided. All questions answered.  Return in about 1 week (around 09/19/2020).  Orlie Pollen, CNM, MSN [facility], [facility] 09/12/2020, 9:23 AM

## 2020-09-12 NOTE — Progress Notes (Signed)
No concerns; wants cx [email or web address]

## 2020-09-15 ENCOUNTER — Observation Stay
Admission: EM | Admit: 2020-09-15 | Discharge: 2020-09-15 | Disposition: A | Discharge status: Home / Self Care | Payer: Medicaid Other | Attending: Obstetrics and Gynecology | Admitting: Obstetrics and Gynecology

## 2020-09-15 ENCOUNTER — Encounter: Payer: Self-pay | Admitting: Obstetrics and Gynecology

## 2020-09-15 ENCOUNTER — Other Ambulatory Visit: Payer: Self-pay

## 2020-09-15 DIAGNOSIS — O4290 Premature rupture of membranes, unspecified as to length of time between rupture and onset of labor, unspecified weeks of gestation: Secondary | ICD-10-CM | POA: Diagnosis present

## 2020-09-15 DIAGNOSIS — Z3A00 Weeks of gestation of pregnancy not specified: Secondary | ICD-10-CM | POA: Diagnosis not present

## 2020-09-15 DIAGNOSIS — O42013 Preterm premature rupture of membranes, onset of labor within 24 hours of rupture, third trimester: Principal | ICD-10-CM | POA: Insufficient documentation

## 2020-09-15 DIAGNOSIS — Z3403 Encounter for supervision of normal first pregnancy, third trimester: Secondary | ICD-10-CM

## 2020-09-15 LAB — RUPTURE OF MEMBRANE (ROM)PLUS: Rom Plus: NEGATIVE

## 2020-09-15 NOTE — Discharge Summary (Signed)
Physician Discharge Summary   Patient ID: Vanessa Vincent [id number] 19 y.o. 2001/01/18  Admit date: 09/15/2020  Discharge date and time: No discharge date for patient encounter.   Admitting Physician: Homero Fellers, MD   Discharge Physician: Adrian Prows MD  Admission Diagnoses: Leakage of amniotic fluid [O42.90]  Discharge Diagnoses: No leakage of fluid. Amniotic membranes intact. [redacted] weeks pregnant  Admission Condition: good  Discharged Condition: good  Indication for Admission: evaluation of ruptues3  Hospital Course: Patient was admitted for evaluation of rupture of membranes. She reports that she had noticed a wetness of her bottom when she went to the bathroom today. She wore a pad and it and her underwear were wet earlier today. Perineum dry. Rom plus negative. Bedside AFI 17.7 cm. Reassurance given to patient.  Fetal heart rate tracing was reactive.   Consults: None  Significant Diagnostic Studies: labs: ROM plus  Treatments: none  Discharge Exam: BP 135/83 (BP Location: Left Arm)   Pulse 85   Temp 98.9 F (37.2 C) (Oral)   Resp 18   Ht 5\' 5"  (1.651 m)   Wt 84.4 kg   LMP 12/09/2019   BMI 30.95 kg/m   General Appearance:    Alert, cooperative, no distress, appears stated age  Head:    Normocephalic, without obvious abnormality, atraumatic  Eyes:    PERRL, conjunctiva/corneas clear, EOM's intact, fundi    benign, both eyes  Ears:    Normal TM's and external ear canals, both ears  Nose:   Nares normal, septum midline, mucosa normal, no drainage    or sinus tenderness  Throat:   Lips, mucosa, and tongue normal; teeth and gums normal  Neck:   Supple, symmetrical, trachea midline, no adenopathy;    thyroid:  no enlargement/tenderness/nodules; no carotid   bruit or JVD  Back:     Symmetric, no curvature, ROM normal, no CVA tenderness  Lungs:     Clear to auscultation bilaterally, respirations unlabored  Chest Wall:    No tenderness or deformity    Heart:    Regular rate and rhythm, S1 and S2 normal, no murmur, rub   or gallop  Breast Exam:    No tenderness, masses, or nipple abnormality  Abdomen:     Soft, non-tender, bowel sounds active all four quadrants,    no masses, no organomegaly  Genitalia:    Normal female without lesion, discharge or tenderness  Rectal:    Normal tone, normal prostate, no masses or tenderness;   guaiac negative stool  Extremities:   Extremities normal, atraumatic, no cyanosis or edema  Pulses:   2+ and symmetric all extremities  Skin:   Skin color, texture, turgor normal, no rashes or lesions  Lymph nodes:   Cervical, supraclavicular, and axillary nodes normal  Neurologic:   CNII-XII intact, normal strength, sensation and reflexes    throughout    Disposition: Discharge disposition: 01-Home or Self Care       Patient Instructions:  Allergies as of 09/15/2020   No Known Allergies     Medication List    TAKE these medications   docusate sodium 100 MG capsule Commonly known as: COLACE Take 1 capsule (100 mg total) by mouth 2 (two) times daily.   multivitamin-prenatal 27-0.8 MG Tabs tablet Take 1 tablet by mouth daily at 12 noon.   ondansetron 4 MG disintegrating tablet Commonly known as: Zofran ODT Take 1 tablet (4 mg total) by mouth every 6 (six) hours as needed for nausea.  Activity: activity as tolerated Diet: regular diet Wound Care: none needed  Follow-up with [facility] in 1 week.  Signed: Homero Fellers 09/15/2020 6:12 PM

## 2020-09-15 NOTE — Progress Notes (Signed)
Discharge home. Labor precautions given. Questions answered. Left floor ambulatory with sig other. Vanessa Vincent

## 2020-09-15 NOTE — OB Triage Note (Signed)
Pt reports she thinks her water broke around 1300 today. Pt denies noticing a gush of fluid. Denies vaginal bleeding. Reports + fetal movement. Dineen Kid

## 2020-09-18 ENCOUNTER — Ambulatory Visit (INDEPENDENT_AMBULATORY_CARE_PROVIDER_SITE_OTHER): Payer: Medicaid Other | Admitting: Obstetrics and Gynecology

## 2020-09-18 ENCOUNTER — Encounter: Payer: Self-pay | Admitting: Obstetrics and Gynecology

## 2020-09-18 ENCOUNTER — Other Ambulatory Visit: Payer: Self-pay

## 2020-09-18 VITALS — BP 112/68 | Wt 189.0 lb

## 2020-09-18 DIAGNOSIS — Z3A38 38 weeks gestation of pregnancy: Secondary | ICD-10-CM

## 2020-09-18 DIAGNOSIS — Z3403 Encounter for supervision of normal first pregnancy, third trimester: Secondary | ICD-10-CM

## 2020-09-18 LAB — POCT URINALYSIS DIPSTICK OB: Glucose, UA: NEGATIVE

## 2020-09-18 NOTE — Progress Notes (Signed)
ROB - No concerns. RM 4

## 2020-09-18 NOTE — Progress Notes (Signed)
Routine Prenatal Care Visit  Subjective  Vanessa Vincent is a 19 y.o. G1P0000 at [redacted]w[redacted]d being seen today for ongoing prenatal care.  She is currently monitored for the following issues for this low-risk pregnancy and has History of pyelonephritis; Uncontrollable vomiting; Supervision of low-risk first pregnancy, third trimester; Anxiety and depression; Pregnancy related bilateral lower abdominal pain, antepartum; Chronic constipation; and Leakage of amniotic fluid on their problem list.  ----------------------------------------------------------------------------------- Patient reports no complaints.  Occasional contraction and moderate discomfort while sleeping. Contractions: Irregular. Vag. Bleeding: None.  Movement: Present. Denies leaking of fluid.  ----------------------------------------------------------------------------------- The following portions of the patient's history were reviewed and updated as appropriate: allergies, current medications, past family history, past medical history, past social history, past surgical history and problem list. Problem list updated.   Objective  Blood pressure 112/68, weight 189 lb (85.7 kg), last menstrual period 12/09/2019. Pregravid weight 155 lb (70.3 kg) Total Weight Gain 34 lb (15.4 kg) Urinalysis:      Fetal Status: Fetal Heart Rate (bpm): 140 Fundal Height: 39 cm Movement: Present  Presentation: Vertex  General:  Alert, oriented and cooperative. Patient is in no acute distress.  Skin: Skin is warm and dry. No rash noted.   Cardiovascular: Normal heart rate noted  Respiratory: Normal respiratory effort, no problems with respiration noted  Abdomen: Soft, gravid, appropriate for gestational age. Pain/Pressure: Absent     Pelvic:  Cervical exam performed Dilation: Fingertip Effacement (%): 50 Station: -3  Extremities: Normal range of motion.     ental Status: Normal mood and affect. Normal behavior. Normal judgment and thought  content.     Assessment   19 y.o. G1P0000 at [redacted]w[redacted]d by  09/27/2020, by Ultrasound presenting for routine prenatal visit  Plan   Pregnancy#1 Problems (from 12/16/19 to present)    Problem Noted Resolved   Supervision of low-risk first pregnancy, third trimester 02/07/2020 by Dalia Heading, CNM No   Overview Addendum 09/12/2020  8:57 AM by Orlie Pollen, [place] Prenatal Labs  Dating  8wk [place] Blood type: O/Positive/-- (04/19 1611)   Genetic Screen AFP:  NEG    NIPS:   Diploid XX Antibody:Negative (04/19 1611)  Anatomic [place]  normal female Rubella: 7.39 (04/19 1611)   Varicella: Immune  GTT  28 wk:   89   RPR: Non Reactive (09/10 1009)   Rhogam  not needed HBsAg: Negative (04/19 1611)   TDaP vaccine               07/28/20        HIV: Non Reactive (09/10 1009)   Flu Shot        07/14/20                        QVZ:DGLOVFIE/-- (11/17 1215)  Contraception POP Pap: under 21  CBB  no   CS/VBAC n/a   Baby Food Breast   Support Person             Previous Version       Term obstetric precautions including but not limited to vaginal bleeding, contractions, leaking of fluid and fetal movement were reviewed in detail with the patient.    -Reviewed S&S of labor. Discussed IOL by [redacted] weeks gestation - reviewed option for IOL starting at 39 weeks. Patient to consider options and discuss further at next visit.  Return in about 1 week (around 09/25/2020) for ROB.  Orlie Pollen,  CNM, MSN [facility], [facility] 09/18/2020, 10:39 AM

## 2020-09-18 NOTE — Telephone Encounter (Signed)
Patient was evaluated at L&D

## 2020-09-20 ENCOUNTER — Encounter: Payer: Self-pay | Admitting: Emergency Medicine

## 2020-09-20 ENCOUNTER — Inpatient Hospital Stay: Payer: Medicaid Other | Admitting: Registered Nurse

## 2020-09-20 ENCOUNTER — Inpatient Hospital Stay
Admission: EM | Admit: 2020-09-20 | Discharge: 2020-09-22 | DRG: 787 | Disposition: A | Discharge status: Home / Self Care | Payer: Medicaid Other | Attending: Obstetrics and Gynecology | Admitting: Obstetrics and Gynecology

## 2020-09-20 ENCOUNTER — Encounter
Admission: EM | Disposition: A | Discharge status: Home / Self Care | Payer: Self-pay | Source: Home / Self Care | Attending: Obstetrics and Gynecology

## 2020-09-20 ENCOUNTER — Other Ambulatory Visit: Payer: Self-pay

## 2020-09-20 DIAGNOSIS — Z3403 Encounter for supervision of normal first pregnancy, third trimester: Secondary | ICD-10-CM

## 2020-09-20 DIAGNOSIS — Z3A39 39 weeks gestation of pregnancy: Secondary | ICD-10-CM

## 2020-09-20 DIAGNOSIS — O9081 Anemia of the puerperium: Secondary | ICD-10-CM | POA: Diagnosis not present

## 2020-09-20 DIAGNOSIS — Z20822 Contact with and (suspected) exposure to covid-19: Secondary | ICD-10-CM | POA: Diagnosis present

## 2020-09-20 DIAGNOSIS — O4292 Full-term premature rupture of membranes, unspecified as to length of time between rupture and onset of labor: Principal | ICD-10-CM | POA: Diagnosis present

## 2020-09-20 DIAGNOSIS — D62 Acute posthemorrhagic anemia: Secondary | ICD-10-CM | POA: Diagnosis not present

## 2020-09-20 DIAGNOSIS — O4290 Premature rupture of membranes, unspecified as to length of time between rupture and onset of labor, unspecified weeks of gestation: Secondary | ICD-10-CM | POA: Diagnosis present

## 2020-09-20 DIAGNOSIS — O26893 Other specified pregnancy related conditions, third trimester: Secondary | ICD-10-CM | POA: Diagnosis present

## 2020-09-20 LAB — CBC
HCT: 34.2 % — ABNORMAL LOW (ref 36.0–46.0)
Hemoglobin: 11.9 g/dL — ABNORMAL LOW (ref 12.0–15.0)
MCH: 32.4 pg (ref 26.0–34.0)
MCHC: 34.8 g/dL (ref 30.0–36.0)
MCV: 93.2 fL (ref 80.0–100.0)
Platelets: 123 10*3/uL — ABNORMAL LOW (ref 150–400)
RBC: 3.67 MIL/uL — ABNORMAL LOW (ref 3.87–5.11)
RDW: 13.1 % (ref 11.5–15.5)
WBC: 10.7 10*3/uL — ABNORMAL HIGH (ref 4.0–10.5)
nRBC: 0 % (ref 0.0–0.2)

## 2020-09-20 LAB — RAPID HIV SCREEN (HIV 1/2 AB+AG)
HIV 1/2 Antibodies: NONREACTIVE
HIV-1 P24 Antigen - HIV24: NONREACTIVE

## 2020-09-20 LAB — RESP PANEL BY RT-PCR (FLU A&B, COVID) ARPGX2
Influenza A by PCR: NEGATIVE
Influenza B by PCR: NEGATIVE
SARS Coronavirus 2 by RT PCR: NEGATIVE

## 2020-09-20 SURGERY — Surgical Case
Anesthesia: Epidural

## 2020-09-20 MED ORDER — SENNOSIDES-DOCUSATE SODIUM 8.6-50 MG PO TABS
2.0000 | ORAL_TABLET | ORAL | Status: DC
  Administered 2020-09-21 – 2020-09-22 (×2): 2 via ORAL
  Filled 2020-09-20 (×2): qty 2

## 2020-09-20 MED ORDER — EPHEDRINE 5 MG/ML INJ: 10.0000 mg | INTRAVENOUS | Status: DC | PRN

## 2020-09-20 MED ORDER — DIPHENHYDRAMINE HCL 25 MG PO CAPS: 25.0000 mg | ORAL_CAPSULE | Freq: Four times a day (QID) | ORAL | Status: DC | PRN

## 2020-09-20 MED ORDER — NALBUPHINE HCL 10 MG/ML IJ SOLN: 5.0000 mg | Freq: Once | INTRAMUSCULAR | Status: DC | PRN

## 2020-09-20 MED ORDER — ACETAMINOPHEN 500 MG PO TABS
1000.0000 mg | ORAL_TABLET | Freq: Four times a day (QID) | ORAL | Status: AC
  Administered 2020-09-21 (×3): 1000 mg via ORAL
  Filled 2020-09-20 (×3): qty 2

## 2020-09-20 MED ORDER — ACETAMINOPHEN 325 MG PO TABS: 650.0000 mg | ORAL_TABLET | ORAL | Status: DC | PRN

## 2020-09-20 MED ORDER — LIDOCAINE HCL (PF) 2 % IJ SOLN
INTRAMUSCULAR | Status: AC
  Filled 2020-09-20: qty 10

## 2020-09-20 MED ORDER — LACTATED RINGERS IV SOLN: INTRAVENOUS | Status: DC

## 2020-09-20 MED ORDER — LIDOCAINE HCL (PF) 1 % IJ SOLN: 30.0000 mL | INTRAMUSCULAR | Status: DC | PRN

## 2020-09-20 MED ORDER — NALOXONE HCL 0.4 MG/ML IJ SOLN: 0.4000 mg | INTRAMUSCULAR | Status: DC | PRN

## 2020-09-20 MED ORDER — DIPHENHYDRAMINE HCL 50 MG/ML IJ SOLN: 12.5000 mg | INTRAMUSCULAR | Status: DC | PRN

## 2020-09-20 MED ORDER — OXYTOCIN-SODIUM CHLORIDE 30-0.9 UT/500ML-% IV SOLN
INTRAVENOUS | Status: AC
  Filled 2020-09-20: qty 500

## 2020-09-20 MED ORDER — MORPHINE SULFATE (PF) 0.5 MG/ML IJ SOLN
INTRAMUSCULAR | Status: DC | PRN
  Administered 2020-09-20: 2.5 mg via EPIDURAL

## 2020-09-20 MED ORDER — OXYTOCIN-SODIUM CHLORIDE 30-0.9 UT/500ML-% IV SOLN
1.0000 m[IU]/min | INTRAVENOUS | Status: DC
  Administered 2020-09-20 (×2): 2 m[IU]/min via INTRAVENOUS
  Filled 2020-09-20: qty 500

## 2020-09-20 MED ORDER — SODIUM CHLORIDE 0.9 % IV SOLN
INTRAVENOUS | Status: DC | PRN
  Administered 2020-09-20: 25 ug/min via INTRAVENOUS

## 2020-09-20 MED ORDER — FENTANYL CITRATE (PF) 100 MCG/2ML IJ SOLN: 25.0000 ug | INTRAMUSCULAR | Status: DC | PRN

## 2020-09-20 MED ORDER — LACTATED RINGERS IV SOLN
500.0000 mL | Freq: Once | INTRAVENOUS | Status: AC
  Administered 2020-09-20: 500 mL via INTRAVENOUS

## 2020-09-20 MED ORDER — DEXAMETHASONE SODIUM PHOSPHATE 10 MG/ML IJ SOLN
INTRAMUSCULAR | Status: DC | PRN
  Administered 2020-09-20: 10 mg via INTRAVENOUS

## 2020-09-20 MED ORDER — OXYCODONE-ACETAMINOPHEN 5-325 MG PO TABS
1.0000 | ORAL_TABLET | ORAL | Status: DC | PRN
  Administered 2020-09-21 – 2020-09-22 (×2): 1 via ORAL
  Filled 2020-09-20 (×2): qty 1

## 2020-09-20 MED ORDER — OXYTOCIN-SODIUM CHLORIDE 30-0.9 UT/500ML-% IV SOLN
2.5000 [IU]/h | INTRAVENOUS | Status: DC
  Administered 2020-09-20: 30 [IU] via INTRAVENOUS

## 2020-09-20 MED ORDER — LACTATED RINGERS IV SOLN
500.0000 mL | INTRAVENOUS | Status: DC | PRN
  Administered 2020-09-20: 250 mL via INTRAVENOUS

## 2020-09-20 MED ORDER — BUPIVACAINE HCL (PF) 0.5 % IJ SOLN
INTRAMUSCULAR | Status: DC | PRN
  Administered 2020-09-20: 10 mL

## 2020-09-20 MED ORDER — MEPERIDINE HCL 25 MG/ML IJ SOLN: 6.2500 mg | INTRAMUSCULAR | Status: DC | PRN

## 2020-09-20 MED ORDER — NALBUPHINE HCL 10 MG/ML IJ SOLN: 5.0000 mg | INTRAMUSCULAR | Status: DC | PRN

## 2020-09-20 MED ORDER — MENTHOL 3 MG MT LOZG
1.0000 | LOZENGE | OROMUCOSAL | Status: DC | PRN
  Filled 2020-09-20: qty 9

## 2020-09-20 MED ORDER — DIPHENHYDRAMINE HCL 25 MG PO CAPS: 25.0000 mg | ORAL_CAPSULE | ORAL | Status: DC | PRN

## 2020-09-20 MED ORDER — LIDOCAINE HCL (PF) 1 % IJ SOLN
INTRAMUSCULAR | Status: DC | PRN
  Administered 2020-09-20: 3 mL via SUBCUTANEOUS

## 2020-09-20 MED ORDER — ACETAMINOPHEN 500 MG PO TABS
1000.0000 mg | ORAL_TABLET | Freq: Four times a day (QID) | ORAL | Status: DC
  Administered 2020-09-20: 1000 mg via ORAL
  Filled 2020-09-20: qty 2

## 2020-09-20 MED ORDER — KETOROLAC TROMETHAMINE 30 MG/ML IJ SOLN
INTRAMUSCULAR | Status: AC
  Administered 2020-09-20: 30 mg via INTRAVENOUS
  Filled 2020-09-20: qty 1

## 2020-09-20 MED ORDER — NALOXONE HCL 4 MG/10ML IJ SOLN
1.0000 ug/kg/h | INTRAVENOUS | Status: DC | PRN
  Filled 2020-09-20: qty 5

## 2020-09-20 MED ORDER — COCONUT OIL OIL
1.0000 "application " | TOPICAL_OIL | Status: DC | PRN
  Filled 2020-09-20: qty 120

## 2020-09-20 MED ORDER — LIDOCAINE-EPINEPHRINE (PF) 1.5 %-1:200000 IJ SOLN
INTRAMUSCULAR | Status: DC | PRN
  Administered 2020-09-20: 3 mL via EPIDURAL

## 2020-09-20 MED ORDER — PRENATAL MULTIVITAMIN CH
1.0000 | ORAL_TABLET | Freq: Every day | ORAL | Status: DC
  Administered 2020-09-21: 1 via ORAL
  Filled 2020-09-20: qty 1

## 2020-09-20 MED ORDER — DIBUCAINE (PERIANAL) 1 % EX OINT: 1.0000 "application " | TOPICAL_OINTMENT | CUTANEOUS | Status: DC | PRN

## 2020-09-20 MED ORDER — MORPHINE SULFATE (PF) 0.5 MG/ML IJ SOLN
INTRAMUSCULAR | Status: AC
  Filled 2020-09-20: qty 10

## 2020-09-20 MED ORDER — BUTORPHANOL TARTRATE 1 MG/ML IJ SOLN: 1.0000 mg | Freq: Two times a day (BID) | INTRAMUSCULAR | Status: DC | PRN

## 2020-09-20 MED ORDER — PHENYLEPHRINE 40 MCG/ML (10ML) SYRINGE FOR IV PUSH (FOR BLOOD PRESSURE SUPPORT): 80.0000 ug | PREFILLED_SYRINGE | INTRAVENOUS | Status: DC | PRN

## 2020-09-20 MED ORDER — SOD CITRATE-CITRIC ACID 500-334 MG/5ML PO SOLN: 30.0000 mL | ORAL | Status: DC | PRN

## 2020-09-20 MED ORDER — BUPIVACAINE HCL (PF) 0.25 % IJ SOLN
INTRAMUSCULAR | Status: DC | PRN
  Administered 2020-09-20: 4 mL via EPIDURAL
  Administered 2020-09-20: 2 mL via EPIDURAL

## 2020-09-20 MED ORDER — LIDOCAINE HCL (PF) 1 % IJ SOLN
INTRAMUSCULAR | Status: AC
  Filled 2020-09-20: qty 30

## 2020-09-20 MED ORDER — SODIUM CHLORIDE 0.9% FLUSH: 3.0000 mL | INTRAVENOUS | Status: DC | PRN

## 2020-09-20 MED ORDER — TERBUTALINE SULFATE 1 MG/ML IJ SOLN: 0.2500 mg | Freq: Once | INTRAMUSCULAR | Status: DC | PRN

## 2020-09-20 MED ORDER — SIMETHICONE 80 MG PO CHEW
80.0000 mg | CHEWABLE_TABLET | ORAL | Status: DC
  Administered 2020-09-20 – 2020-09-21 (×2): 80 mg via ORAL
  Filled 2020-09-20 (×2): qty 1

## 2020-09-20 MED ORDER — ONDANSETRON HCL 4 MG/2ML IJ SOLN: 4.0000 mg | Freq: Three times a day (TID) | INTRAMUSCULAR | Status: DC | PRN

## 2020-09-20 MED ORDER — WITCH HAZEL-GLYCERIN EX PADS: 1.0000 "application " | MEDICATED_PAD | CUTANEOUS | Status: DC | PRN

## 2020-09-20 MED ORDER — FENTANYL 2.5 MCG/ML W/ROPIVACAINE 0.15% IN NS 100 ML EPIDURAL (ARMC)
12.0000 mL/h | EPIDURAL | Status: DC
  Administered 2020-09-20: 12 mL/h via EPIDURAL

## 2020-09-20 MED ORDER — FENTANYL 2.5 MCG/ML W/ROPIVACAINE 0.15% IN NS 100 ML EPIDURAL (ARMC)
EPIDURAL | Status: AC
  Filled 2020-09-20: qty 100

## 2020-09-20 MED ORDER — MISOPROSTOL 200 MCG PO TABS
ORAL_TABLET | ORAL | Status: AC
  Administered 2020-09-20: 25 ug via ORAL
  Filled 2020-09-20: qty 4

## 2020-09-20 MED ORDER — KETOROLAC TROMETHAMINE 30 MG/ML IJ SOLN
30.0000 mg | Freq: Four times a day (QID) | INTRAMUSCULAR | Status: DC | PRN
  Administered 2020-09-21 (×2): 30 mg via INTRAVENOUS
  Filled 2020-09-20 (×2): qty 1

## 2020-09-20 MED ORDER — SIMETHICONE 80 MG PO CHEW
80.0000 mg | CHEWABLE_TABLET | Freq: Three times a day (TID) | ORAL | Status: DC
  Administered 2020-09-21 – 2020-09-22 (×4): 80 mg via ORAL
  Filled 2020-09-20 (×4): qty 1

## 2020-09-20 MED ORDER — FENTANYL CITRATE (PF) 100 MCG/2ML IJ SOLN
INTRAMUSCULAR | Status: AC
  Filled 2020-09-20: qty 2

## 2020-09-20 MED ORDER — SOD CITRATE-CITRIC ACID 500-334 MG/5ML PO SOLN
ORAL | Status: AC
  Administered 2020-09-20: 30 mL via ORAL
  Filled 2020-09-20: qty 15

## 2020-09-20 MED ORDER — ONDANSETRON HCL 4 MG/2ML IJ SOLN
INTRAMUSCULAR | Status: DC | PRN
  Administered 2020-09-20: 4 mg via INTRAVENOUS

## 2020-09-20 MED ORDER — MISOPROSTOL 25 MCG QUARTER TABLET: 25.0000 ug | ORAL_TABLET | Freq: Once | ORAL | Status: AC

## 2020-09-20 MED ORDER — IBUPROFEN 800 MG PO TABS
800.0000 mg | ORAL_TABLET | Freq: Three times a day (TID) | ORAL | Status: DC
  Administered 2020-09-21: 800 mg via ORAL
  Filled 2020-09-20: qty 1

## 2020-09-20 MED ORDER — OXYTOCIN-SODIUM CHLORIDE 30-0.9 UT/500ML-% IV SOLN: 2.5000 [IU]/h | INTRAVENOUS | Status: DC

## 2020-09-20 MED ORDER — BUPIVACAINE 0.25 % ON-Q PUMP DUAL CATH 400 ML
400.0000 mL | INJECTION | Status: DC
  Filled 2020-09-20: qty 400

## 2020-09-20 MED ORDER — MISOPROSTOL 50MCG HALF TABLET
50.0000 ug | ORAL_TABLET | Freq: Once | ORAL | Status: DC
  Filled 2020-09-20: qty 1

## 2020-09-20 MED ORDER — OXYTOCIN BOLUS FROM INFUSION: 333.0000 mL | Freq: Once | INTRAVENOUS | Status: DC

## 2020-09-20 MED ORDER — DEXAMETHASONE SODIUM PHOSPHATE 10 MG/ML IJ SOLN
INTRAMUSCULAR | Status: AC
  Filled 2020-09-20: qty 1

## 2020-09-20 MED ORDER — KETOROLAC TROMETHAMINE 30 MG/ML IJ SOLN: 30.0000 mg | Freq: Four times a day (QID) | INTRAMUSCULAR | Status: DC | PRN

## 2020-09-20 MED ORDER — SCOPOLAMINE 1 MG/3DAYS TD PT72: 1.0000 | MEDICATED_PATCH | Freq: Once | TRANSDERMAL | Status: DC

## 2020-09-20 MED ORDER — LIDOCAINE HCL (PF) 2 % IJ SOLN
INTRAMUSCULAR | Status: DC | PRN
  Administered 2020-09-20 (×4): 100 mg via EPIDURAL

## 2020-09-20 MED ORDER — AMMONIA AROMATIC IN INHA
RESPIRATORY_TRACT | Status: AC
  Filled 2020-09-20: qty 10

## 2020-09-20 MED ORDER — SODIUM CHLORIDE 0.9 % IV SOLN
500.0000 mg | Freq: Once | INTRAVENOUS | Status: AC
  Administered 2020-09-20: 500 mg via INTRAVENOUS
  Filled 2020-09-20: qty 500

## 2020-09-20 MED ORDER — ONDANSETRON HCL 4 MG/2ML IJ SOLN
INTRAMUSCULAR | Status: AC
  Filled 2020-09-20: qty 2

## 2020-09-20 MED ORDER — BUTORPHANOL TARTRATE 1 MG/ML IJ SOLN
2.0000 mg | INTRAMUSCULAR | Status: DC | PRN
  Administered 2020-09-20: 1 mg via INTRAVENOUS
  Filled 2020-09-20: qty 2

## 2020-09-20 MED ORDER — ONDANSETRON HCL 4 MG/2ML IJ SOLN: 4.0000 mg | Freq: Once | INTRAMUSCULAR | Status: DC | PRN

## 2020-09-20 MED ORDER — BUPIVACAINE HCL (PF) 0.5 % IJ SOLN
20.0000 mL | INTRAMUSCULAR | Status: DC
  Filled 2020-09-20: qty 20

## 2020-09-20 MED ORDER — MISOPROSTOL 25 MCG QUARTER TABLET
25.0000 ug | ORAL_TABLET | ORAL | Status: DC | PRN
  Filled 2020-09-20: qty 1

## 2020-09-20 MED ORDER — CEFAZOLIN SODIUM-DEXTROSE 2-4 GM/100ML-% IV SOLN
2.0000 g | INTRAVENOUS | Status: AC
  Administered 2020-09-20: 2 g via INTRAVENOUS
  Filled 2020-09-20: qty 100

## 2020-09-20 MED ORDER — SOD CITRATE-CITRIC ACID 500-334 MG/5ML PO SOLN: 30.0000 mL | ORAL | Status: AC

## 2020-09-20 MED ORDER — FENTANYL CITRATE (PF) 100 MCG/2ML IJ SOLN
INTRAMUSCULAR | Status: DC | PRN
  Administered 2020-09-20: 100 ug via EPIDURAL

## 2020-09-20 MED ORDER — OXYTOCIN 10 UNIT/ML IJ SOLN
INTRAMUSCULAR | Status: AC
  Filled 2020-09-20: qty 2

## 2020-09-20 MED ORDER — BUPIVACAINE HCL (PF) 0.5 % IJ SOLN
INTRAMUSCULAR | Status: AC
  Filled 2020-09-20: qty 30

## 2020-09-20 MED ORDER — ONDANSETRON HCL 4 MG/2ML IJ SOLN
4.0000 mg | Freq: Four times a day (QID) | INTRAMUSCULAR | Status: DC | PRN
  Administered 2020-09-20: 4 mg via INTRAVENOUS
  Filled 2020-09-20: qty 2

## 2020-09-20 MED ORDER — SIMETHICONE 80 MG PO CHEW: 80.0000 mg | CHEWABLE_TABLET | ORAL | Status: DC | PRN

## 2020-09-20 SURGICAL SUPPLY — 29 items
BAG COUNTER SPONGE EZ (MISCELLANEOUS) ×2 IMPLANT
CANISTER SUCT 3000ML PPV (MISCELLANEOUS) ×2 IMPLANT
CATH KIT ON-Q SILVERSOAK 5IN (CATHETERS) ×4 IMPLANT
CHLORAPREP W/TINT 26 (MISCELLANEOUS) ×4 IMPLANT
DERMABOND ADVANCED (GAUZE/BANDAGES/DRESSINGS) ×1
DERMABOND ADVANCED .7 DNX12 (GAUZE/BANDAGES/DRESSINGS) ×1 IMPLANT
DRSG OPSITE POSTOP 4X10 (GAUZE/BANDAGES/DRESSINGS) ×2 IMPLANT
DRSG TELFA 3X8 NADH (GAUZE/BANDAGES/DRESSINGS) ×2 IMPLANT
ELECT CAUTERY BLADE 6.4 (BLADE) ×2 IMPLANT
ELECT REM PT RETURN 9FT ADLT (ELECTROSURGICAL) ×2
ELECTRODE REM PT RTRN 9FT ADLT (ELECTROSURGICAL) ×1 IMPLANT
GAUZE SPONGE 4X4 12PLY STRL (GAUZE/BANDAGES/DRESSINGS) ×2 IMPLANT
GLOVE BIO SURGEON STRL SZ7 (GLOVE) ×2 IMPLANT
GLOVE INDICATOR 7.5 STRL GRN (GLOVE) ×2 IMPLANT
GOWN STRL REUS W/ TWL LRG LVL3 (GOWN DISPOSABLE) ×3 IMPLANT
GOWN STRL REUS W/TWL LRG LVL3 (GOWN DISPOSABLE) ×6
MANIFOLD NEPTUNE II (INSTRUMENTS) ×2 IMPLANT
NS IRRIG 1000ML POUR BTL (IV SOLUTION) ×2 IMPLANT
PACK C SECTION AR (MISCELLANEOUS) ×2 IMPLANT
PAD OB MATERNITY 4.3X12.25 (PERSONAL CARE ITEMS) ×2 IMPLANT
PAD PREP 24X41 OB/GYN DISP (PERSONAL CARE ITEMS) ×2 IMPLANT
PENCIL SMOKE ULTRAEVAC 22 CON (MISCELLANEOUS) ×2 IMPLANT
STAPLER INSORB 30 2030 C-SECTI (MISCELLANEOUS) ×2 IMPLANT
STRIP CLOSURE SKIN 1/2X4 (GAUZE/BANDAGES/DRESSINGS) ×2 IMPLANT
SUT MNCRL AB 4-0 PS2 18 (SUTURE) ×2 IMPLANT
SUT PDS AB 1 TP1 96 (SUTURE) ×4 IMPLANT
SUT VIC AB 0 CTX 36 (SUTURE) ×4
SUT VIC AB 0 CTX36XBRD ANBCTRL (SUTURE) ×2 IMPLANT
SUT VIC AB 2-0 CT1 36 (SUTURE) ×2 IMPLANT

## 2020-09-20 NOTE — Transfer of Care (Signed)
Immediate Anesthesia Transfer of Care Note  Patient: Vanessa Vincent  Procedure(s) Performed: CESAREAN SECTION  Patient Location: Mother/Baby  Anesthesia Type:Epidural  Level of Consciousness: awake, alert  and oriented  Airway & Oxygen Therapy: Patient Spontanous Breathing  Post-op Assessment: Report given to RN and Post -op Vital signs reviewed and stable  Post vital signs: Reviewed  Last Vitals:  Vitals Value Taken Time  BP 104/52 09/20/20 1944  Temp 36.6 C 09/20/20 1944  Pulse 102 09/20/20 1944  Resp 18 09/20/20 1944  SpO2 98 % 09/20/20 1944    Last Pain:  Vitals:   09/20/20 1714  TempSrc: Oral  PainSc:          Complications: No complications documented.

## 2020-09-20 NOTE — Anesthesia Procedure Notes (Signed)
Epidural Patient location during procedure: OB  Staffing Anesthesiologist: Martha Clan, MD Resident/CRNA: Hedda Slade, CRNA Performed: resident/CRNA   Preanesthetic Checklist Completed: patient identified, IV checked, site marked, risks and benefits discussed, surgical consent, monitors and equipment checked, pre-op evaluation and timeout performed  Epidural Patient position: sitting Prep: ChloraPrep Patient monitoring: heart rate, continuous pulse ox and blood pressure Approach: midline Location: L3-L4 Injection technique: LOR saline  Needle:  Needle type: Tuohy  Needle gauge: 17 G Needle length: 9 cm and 9 Needle insertion depth: 6 cm Catheter type: closed end flexible Catheter size: 19 Gauge Catheter at skin depth: 11 cm Test dose: negative and 1.5% lidocaine with Epi 1:200 K  Assessment Events: blood not aspirated, injection not painful, no injection resistance, no paresthesia and negative IV test  Additional Notes 1 attempt Pt. Evaluated and documentation done after procedure finished. Patient identified. Risks/Benefits/Options discussed with patient including but not limited to bleeding, infection, nerve damage, paralysis, failed block, incomplete pain control, headache, blood pressure changes, nausea, vomiting, reactions to medication both or allergic, itching and postpartum back pain. Confirmed with bedside nurse the patient's most recent platelet count. Confirmed with patient that they are not currently taking any anticoagulation, have any bleeding history or any family history of bleeding disorders. Patient expressed understanding and wished to proceed. All questions were answered. Sterile technique was used throughout the entire procedure. Please see nursing notes for vital signs. Test dose was given through epidural catheter and negative prior to continuing to dose epidural or start infusion. Warning signs of high block given to the patient including shortness of  breath, tingling/numbness in hands, complete motor block, or any concerning symptoms with instructions to call for help. Patient was given instructions on fall risk and not to get out of bed. All questions and concerns addressed with instructions to call with any issues or inadequate analgesia.   Patient tolerated the insertion well without immediate complications.Reason for block:procedure for pain

## 2020-09-20 NOTE — Progress Notes (Signed)
Labor Progress Note  Vanessa Vincent is a 19 y.o. G1P0000 at [redacted]w[redacted]d by ultrasound admitted for PROM - now IOL  Subjective: Patient resting comfortably with epidural placed. Denies sensation of contractions at this time.  Objective: BP 133/72   Pulse 69   Temp 98.5 F (36.9 C) (Axillary)   Resp 18   Ht 5\' 5"  (1.651 m)   Wt 85.3 kg   LMP 12/09/2019   SpO2 98%   Breastfeeding Unknown   BMI 31.28 kg/m  Notable VS details: Remains afebrile   Called to bedside by RN at 1450 - reviewed tracing at that time [name] - 175 bmp, minimal to moderate variability with recurrent late decels Patient with multiple position changes per RN, fluid bolus with epidural placement at 1400 (completed at 1420).  Reviewed plan of care with Dr. Georgianne Fick via phone at 1505.  Pitocin held, patient repositioned to high fowlers.  Strip reassessed.  Fetal Assessment: FHT:  FHR: 170 bpm, variability: minimal to moderate,  accelerations:  Abscent,  decelerations:  Present late decels resolved - now intermittent variables Category/reactivity:  Category II UC:   irregular, every  1.5-4 minutes SVE:    Membrane status: PROM Amniotic color: clear  Labs: Lab Results  Component Value Date   WBC 10.7 (H) 09/20/2020   HGB 11.9 (L) 09/20/2020   HCT 34.2 (L) 09/20/2020   MCV 93.2 09/20/2020   PLT 123 (L) 09/20/2020    Assessment / Plan: Induction of labor due to PROM. Restart pitocin as tolerated. Continue to monitor closely.  Labor: Will continue to titrate pitocin as tolerated Preeclampsia:  no S&S of PIH Fetal Wellbeing:  Category II Pain Control:  Epidural I/D:  GBS neg Anticipated MOD:  NSVD   Orlie Pollen, CNM 09/20/2020, 3:42 PM

## 2020-09-20 NOTE — Progress Notes (Signed)
Labor Progress Note  Vanessa Vincent is a 19 y.o. G1P0000 at [redacted]w[redacted]d by ultrasound admitted for PROM - now IOL.  Subjective: Patient tolerating frequent position changes well. Pain continues to be well controlled with epidural.  Objective: BP 94/69   Pulse 75   Temp 97.9 F (36.6 C) (Oral)   Resp 18   Ht 5\' 5"  (1.651 m)   Wt 85.3 kg   LMP 12/09/2019   SpO2 91%   Breastfeeding Unknown   BMI 31.28 kg/m  Notable VS details: Remains afebrile  1645 - Dr Georgianne Fick to bedside to review plan of care and discuss course with patient 1750 - Repetitive late decelerations noted - pitocin held, position changed    Fetal Assessment: FHT:  FHR: 160 bpm, variability: minimal to moderate,  accelerations:  Abscent,  decelerations:  Present repetitive late Category/reactivity:  Category II UC:   regular, every 2-4 minutes - palpate moderate SVE:   3/100/+1 Membrane status:PROM Amniotic color: clear  Labs: Lab Results  Component Value Date   WBC 10.7 (H) 09/20/2020   HGB 11.9 (L) 09/20/2020   HCT 34.2 (L) 09/20/2020   MCV 93.2 09/20/2020   PLT 123 (L) 09/20/2020    Assessment / Plan: IOL - pitocin on hold due to fetal intolerance  Labor: Hold pitocin - Dr. Georgianne Fick at bedside to further evaluate for mode of delivery Preeclampsia:  No S&S of PIH Fetal Wellbeing:  Category II Pain Control:  Epidural I/D:  GBS neg Anticipated MOD:  Dr. Georgianne Fick at bedside to evaluate  Orlie Pollen, CNM 09/20/2020, 6:03 PM

## 2020-09-20 NOTE — Progress Notes (Signed)
Pt requesting a warm shower to help with the contractions. RN called Johnston Ebbs, CNM and verbal order to allow pt into the shower, saline lock, and intermittent monitoring. Johnston Ebbs, CNM reviewed strip

## 2020-09-20 NOTE — Discharge Summary (Signed)
OB Discharge Summary     Patient Name: Vanessa Vincent DOB: Aug 07, 2001 MRN: [id number]  Date of admission: 09/20/2020 Delivering MD: Vanessa Vincent   Date of discharge: 09/22/2020  Admitting diagnosis: Leakage of amniotic fluid [O42.90] Intrauterine pregnancy: [redacted]w[redacted]d     Secondary diagnosis:  Active Problems:   Leakage of amniotic fluid   Fetal intolerance to labor, delivered, current hospitalization   Postpartum care following cesarean delivery  Additional problems: Fetal intolerance to labor     Discharge diagnosis: Term Pregnancy Delivered                                                                                                Post partum procedures:none  Augmentation: Pitocin and Cytotec  Complications: None  Hospital course:  Onset of Labor With Unplanned C/S   19 y.o. yo G1P1001 at [redacted]w[redacted]d was admitted in Latent Labor on 09/20/2020. Patient had a labor course significant for fetal intolerance of labor following augmentation for prerlabor rupture of membranes. The patient went for cesarean section due to Non-Reassuring FHR. Delivery details as follows: Membrane Rupture Time/Date: 3:30 AM ,09/20/2020   Delivery Method:C-Section, Low Transverse  Details of operation can be found in separate operative note. Patient had an uncomplicated postpartum course.  She is ambulating,tolerating a regular diet, passing flatus, and urinating well.  Patient is discharged home in stable condition 09/22/20.  Newborn Data: Birth date:09/20/2020  Birth time:6:53 PM  Gender:Female  Living status:Living  Apgars:8 ,9  Weight:2980 g   Physical exam  Vitals:   09/21/20 1800 09/21/20 1940 09/21/20 2315 09/22/20 0751  BP:   94/64 125/84  Pulse: (!) 101 90 83 95  Resp:   18 20  Temp:   98.6 F (37 C) 98 F (36.7 C)  TempSrc:   Oral Oral  SpO2: 95% 96% 99% 100%  Weight:      Height:       General: alert, cooperative and no distress  Her left nipple has a stripe across it- not given  a shield. Lochia: appropriate Uterine Fundus: firm Incision: Healing well with no significant drainage, Dressing is clean, dry, and intact, On Q is in place. DVT Evaluation: No evidence of DVT seen on physical exam. Negative Homan's sign. No cords or calf tenderness. Labs: Lab Results  Component Value Date   WBC 16.2 (H) 09/21/2020   HGB 10.2 (L) 09/21/2020   HCT 30.1 (L) 09/21/2020   MCV 94.1 09/21/2020   PLT 114 (L) 09/21/2020   CMP Latest Ref Rng & Units 10/15/2018  Glucose 70 - 99 mg/dL 93  BUN 6 - 20 mg/dL 13  Creatinine 0.44 - 1.00 mg/dL 0.61  Sodium 135 - 145 mmol/L 140  Potassium 3.5 - 5.1 mmol/L 3.5  Chloride 98 - 111 mmol/L 104  CO2 22 - 32 mmol/L 27  Calcium 8.9 - 10.3 mg/dL 9.5  Total Protein 6.5 - 8.1 g/dL 8.1  Total Bilirubin 0.3 - 1.2 mg/dL 1.2  Alkaline Phos 38 - 126 U/L 79  AST 15 - 41 U/L 21  ALT 0 - 44 U/L 18    Discharge  instruction: per After Visit Summary and "Baby and Me Booklet".  After visit meds:  Allergies as of 09/22/2020   No Known Allergies     Medication List    TAKE these medications   docusate sodium 100 MG capsule Commonly known as: COLACE Take 1 capsule (100 mg total) by mouth 2 (two) times daily.   ibuprofen 800 MG tablet Commonly known as: ADVIL Take 1 tablet (800 mg total) by mouth every 8 (eight) hours.   multivitamin-prenatal 27-0.8 MG Tabs tablet Take 1 tablet by mouth daily at 12 noon.   ondansetron 4 MG disintegrating tablet Commonly known as: Zofran ODT Take 1 tablet (4 mg total) by mouth every 6 (six) hours as needed for nausea.   oxyCODONE-acetaminophen 5-325 MG tablet Commonly known as: PERCOCET/ROXICET Take 1-2 tablets by mouth every 4 (four) hours as needed for up to 7 days for moderate pain or severe pain ((when tolerating fluids)).            Discharge Care Instructions  (From admission, onward)         Start     Ordered   09/22/20 0000  Leave dressing on - Keep it clean, dry, and intact until  clinic visit        09/22/20 1051   09/22/20 0000  Leave dressing on - Keep it clean, dry, and intact until clinic visit        09/22/20 1053          Diet: routine diet  Activity: Advance as tolerated. Pelvic rest for 6 weeks.   Outpatient follow up:1 week Follow up Appt: Future Appointments  Date Time Provider [place]  09/29/2020 10:10 AM Vanessa Mood, MD [facility] None   Follow up Visit:No follow-ups on file.  Postpartum contraception: Progesterone only pills  Newborn Data: Live born female  Birth Weight: 6 lb 9.1 oz (2980 g) APGAR: 8, 9  Newborn Delivery   Birth date/time: 09/20/2020 18:53:00 Delivery type: C-Section, Low Transverse Trial of labor: Yes C-section categorization: Primary      Baby Feeding: Breast Disposition:home with mother  \additional time spent providing lactation education and hands on help. Lactation consult ordered for before she leaves today.     09/22/2020 Vanessa Vincent, CNM

## 2020-09-20 NOTE — Op Note (Signed)
Preoperative Diagnosis: 1) 19 y.o. G1P1001 at [redacted]w[redacted]d with fetal intolerance to labor 2) Prelabor rupture of membranes  Postoperative Diagnosis: 1) 19 y.o. G1P1001 at [redacted]w[redacted]d with fetal intolerance to labor 2) Prelabor rupture of membranes  Operation Performed: Primary low transverse C-section via pfannenstiel skin incision  Indication:Fetal intolerance to labor, repetitive late decelerations remote from delivery  Anesthesia: .Epidural  Primary Surgeon: Malachy Mood, MD  Assistant: Orlie Pollen, CNM this surgery required a high level surgical assistant with none other readily available  Preoperative Antibiotics:2g Ancef and 500mg  IV azythromycin  Estimated Blood Loss: 350 mL  IV Fluids: 838mL  Urine Output:: 126mL  Drains or Tubes: Foley to gravity drainage, ON-Q catheter system  Implants: none  Specimens Removed: none  Complications: none  Intraoperative Findings:  Normal tubes ovaries and uterus.  Delivery resulted in the birth of a liveborn female, APGAR (1 MIN): 8   APGAR (5 MINS): 9, weight pending  Patient Condition: stable  Procedure in Detail:  Patient was taken to the operating room were she was administered regional anesthesia.  She was positioned in the supine position, prepped and draped in the  Usual sterile fashion.  Prior to proceeding with the case a time out was performed and the level of anesthetic was checked and noted to be adequate.  Utilizing the scalpel a pfannenstiel skin incision was made 2cm above the pubic symphysis and carried down sharply to the the level of the rectus fascia.  The fascia was incised in the midline using the scalpel and then extended using mayo scissors.  The superior border of the rectus fascia was grasped with two Kocher clamps and the underlying rectus muscles were dissected of the fascia using blunt dissection.  The median raphae was incised using Mayo scissors.   The inferior border of the rectus fascia was dissected of the  rectus muscles in a similar fashion.  The midline was identified, the peritoneum was entered bluntly and expanded using manual tractions.  The uterus was noted to be in a none rotated position.  Next the bladder blade was placed retracting the bladder caudally.  A bladder flap was not created.  A low transverse incision was scored on the lower uterine segment.  The hysterotomy was entered bluntly using the operators finger.  The hysterotomy incision was extended using manual traction.  The operators hand was placed within the hysterotomy position noting the fetus to be within the OA position.  The vertex was grasped, flexed, brought to the incision, and delivered a traumatically using fundal pressure applied by Orlie Pollen CNM.  The remainder of the body delivered with ease.  The infant was suctioned, cord was clamped and cut before handing off to the awaiting neonatologist.  The placenta was delivered using manual extraction.  The uterus was exteriorized, wiped clean of clots and debris using two moist laps.  The hysterotomy was closed using a two layer closure of 0 Vicryl, with the first being a running locked, the second a vertical imbricating.  The uterus was returned to the abdomen.  The peritoneal gutters were wiped clean of clots and debris using two moist laps.  The hysterotomy incision was re-inspected noted to be hemostatic.  The rectus muscles were inspected noted to be hemostatic.  The superior border of the rectus fascia was grasped with a Kocher clamp.  The ON-Q trocars were then placed 4cm above the superior border of the incision and tunneled subfascially.  The introducers were removed and the catheters were threaded through  the sleeves after which the sleeves were removed.  The fascia was closed using a looped #1 PDS in a running fashion taking 1cm by 1cm bites.  The subcutaneous tissue was irrigated using warm saline, hemostasis achieved using the bovie.  The subcutaneous dead space was less  than 3cm and was not closed.  The skin was closed using insorb staples.  Sponge needle and instrument counts were corrects times two.  The patient tolerated the procedure well and was taken to the recovery room in stable condition.

## 2020-09-20 NOTE — Progress Notes (Signed)
Subjective:  Comfortable epidural in place  Objective:   Vitals: Blood pressure 94/69, pulse 75, temperature 97.9 F (36.6 C), temperature source Oral, resp. rate 18, height 5\' 5"  (1.651 m), weight 85.3 kg, last menstrual period 12/09/2019, SpO2 91 %, unknown if currently breastfeeding. General: NAD Abdomen: gravid, non-tender Cervical Exam:  Dilation: 3.5 Effacement (%): 100 Cervical Position: Middle Station: -1 Presentation: Vertex Exam by:: Anda Kraft Veal,CNM  FHT: 150, moderate period of minimal variability, no accels, recurrent late deceleration Toco: q3-19min  Results for orders placed or performed during the hospital encounter of 09/20/20 (from the past 24 hour(s))  Resp Panel by RT-PCR (Flu A&B, Covid) Nasopharyngeal Swab     Status: None   Collection Time: 09/20/20  6:01 AM   Specimen: Nasopharyngeal Swab; Nasopharyngeal(NP) swabs in vial transport medium  Result Value Ref Range   SARS Coronavirus 2 by RT PCR NEGATIVE NEGATIVE   Influenza A by PCR NEGATIVE NEGATIVE   Influenza B by PCR NEGATIVE NEGATIVE  CBC     Status: Abnormal   Collection Time: 09/20/20  7:02 AM  Result Value Ref Range   WBC 10.7 (H) 4.0 - 10.5 K/uL   RBC 3.67 (L) 3.87 - 5.11 MIL/uL   Hemoglobin 11.9 (L) 12.0 - 15.0 g/dL   HCT 34.2 (L) 36 - 46 %   MCV 93.2 80.0 - 100.0 fL   MCH 32.4 26.0 - 34.0 pg   MCHC 34.8 30.0 - 36.0 g/dL   RDW 13.1 11.5 - 15.5 %   Platelets 123 (L) 150 - 400 K/uL   nRBC 0.0 0.0 - 0.2 %  Type and screen [place]     Status: None   Collection Time: 09/20/20  7:02 AM  Result Value Ref Range   ABO/RH(D) O POS    Antibody Screen NEG    Sample Expiration      09/23/2020,2359 Performed at [facility], [address] [place], [place] [address]   Rapid HIV screen (HIV 1/2 Ab+Ag) ([place] Only)     Status: None   Collection Time: 09/20/20  7:02 AM  Result Value Ref Range   HIV-1 P24 Antigen - HIV24 NON REACTIVE NON REACTIVE   HIV 1/2  Antibodies NON REACTIVE NON REACTIVE   Interpretation (HIV Ag Ab)      A non reactive test result means that HIV 1 or HIV 2 antibodies and HIV 1 p24 antigen were not detected in the specimen.    Assessment:   19 y.o. G1P0000 [redacted]w[redacted]d admitted for prelabor rupture of membranes  Plan:   1) Labor - patient progressed to 3cm, started developing recurrent late which resolved with stopping pitocin and position changes.  However, as attempts were made to retitrate pitocin recurrent late decelerations were once again noted.  The patient was counseled regarding risk and benefits to proceeding with Cesarean section to expedite delivery.  Risk of cesarean section were discussed including risk of bleeding and need for potential intraoperative or postoperative blood transfusion with a rate of approximately 5% quoted for all Cesarean sections, risk of injury to adjacent organs including but not limited to bowl and bladder, the need for additional surgical procedures to address such injuries, and the risk of infection.  The risk of continued attempts at vaginal delivery include but are note limited to worsening fetal or maternal status.  After consideration of options the patient is amenable to proceed with primary cesarean section for delivery.   2) Fetus - cat II tracing  Malachy Mood, MD, [facility], [facility] 09/20/2020, 6:15 PM

## 2020-09-20 NOTE — OB Triage Note (Signed)
Pt is G1P0 with c/o fluid leaking which she states started around 0330 and has been steadily leaking since that time. Pt reports no pregnancy related conditions.

## 2020-09-20 NOTE — Progress Notes (Signed)
Epidural placed at 1400, fetal baseline change at 1419 and repeat decelerations  noted. VSS WNL. Interventions performed and charted, pt moved to right lateral, semi-fowlers, left lateral, and high fowlers. Johnston Ebbs, CNM at bedside and reviewed strip. A.Staebler, MD aware of decelerations. Pitocin turned off at 1503.

## 2020-09-20 NOTE — Anesthesia Preprocedure Evaluation (Signed)
Anesthesia Evaluation  Patient identified by MRN, date of birth, ID band Patient awake    Reviewed: Allergy & Precautions, H&P , NPO status , Patient's Chart, lab work & pertinent test results  Airway Mallampati: II  TM Distance: >3 FB Neck ROM: full    Dental no notable dental hx. (+) Teeth Intact   Pulmonary    Pulmonary exam normal        Cardiovascular Normal cardiovascular exam     Neuro/Psych  Headaches, PSYCHIATRIC DISORDERS Anxiety Depression    GI/Hepatic Neg liver ROS, GERD  Medicated and Controlled,  Endo/Other  negative endocrine ROS  Renal/GU negative Renal ROS  negative genitourinary   Musculoskeletal   Abdominal   Peds  Hematology thrombocytopenia   Anesthesia Other Findings   Reproductive/Obstetrics                             Anesthesia Physical Anesthesia Plan  ASA: II  Anesthesia Plan: Epidural   Post-op Pain Management:    Induction:   PONV Risk Score and Plan:   Airway Management Planned:   Additional Equipment:   Intra-op Plan:   Post-operative Plan:   Informed Consent: I have reviewed the patients History and Physical, chart, labs and discussed the procedure including the risks, benefits and alternatives for the proposed anesthesia with the patient or authorized representative who has indicated his/her understanding and acceptance.       Plan Discussed with: Anesthesiologist and CRNA  Anesthesia Plan Comments:         Anesthesia Quick Evaluation

## 2020-09-20 NOTE — Progress Notes (Signed)
Labor Progress Note  Tawn Fitzner is a 19 y.o. G1P0000 at [redacted]w[redacted]d by ultrasound admitted for PROM - IOL  Subjective: Patient resting in bed. Reports contractions are becoming "more uncomfortable."   Objective: BP 122/75 (BP Location: Right Arm)   Pulse 79   Temp 98.2 F (36.8 C) (Oral)   Resp 18   Ht 5\' 5"  (1.651 m)   Wt 85.3 kg   LMP 12/09/2019   Breastfeeding Unknown   BMI 31.28 kg/m   Fetal Assessment: FHT:  FHR: 140 bpm, variability: moderate,  accelerations:  Present,  decelerations:  Absent Category/reactivity:  Category I UC:   irregular, every 1.5-4 minutes - palpate mild SVE:   deferred Membrane status:PROM Amniotic color: clear  Labs: Lab Results  Component Value Date   WBC 10.7 (H) 09/20/2020   HGB 11.9 (L) 09/20/2020   HCT 34.2 (L) 09/20/2020   MCV 93.2 09/20/2020   PLT 123 (L) 09/20/2020    Assessment / Plan: Induction of labor due to PROM with cytotec  Labor: Progressing - contractions intensifying Preeclampsia:  no signs or symptoms of PIH Fetal Wellbeing:  Category I Pain Control:  Labor support without medications I/D:  n/a -GBS neg Anticipated MOD:  NSVD  Orlie Pollen, CNM 09/20/2020, 9:34 AM

## 2020-09-20 NOTE — Progress Notes (Signed)
Labor Progress Note  Vanessa Vincent is a 19 y.o. G1P0000 at [redacted]w[redacted]d by ultrasound admitted for PROM - IOL.  Subjective: Patient resting in bed. Reports feeling contractions strongly at this time. Has utilized 1 dose of IV pain medication. Also took a hot shower for pain relief. Patient working to breath through contractions. Considering epidural placement.  Objective: BP (!) 133/96 (BP Location: Left Arm) Comment: Repeat BP  Pulse 83   Temp 98.3 F (36.8 C) (Oral)   Resp 18   Ht 5\' 5"  (1.651 m)   Wt 85.3 kg   LMP 12/09/2019   Breastfeeding Unknown   BMI 31.28 kg/m  Notable VS details: Remains afebrile  Fetal Assessment: FHT:  FHR: 150 bpm, variability: minimal to moderate,  accelerations:  Present,  decelerations:  Present late and variable decels noted Category/reactivity:  Category II UC:   irregular, every 1-4 minutes - palpate moderate  SVE:   3/70/-1 Membrane status: PROM Amniotic color: clear  Labs: Lab Results  Component Value Date   WBC 10.7 (H) 09/20/2020   HGB 11.9 (L) 09/20/2020   HCT 34.2 (L) 09/20/2020   MCV 93.2 09/20/2020   PLT 123 (L) 09/20/2020    Assessment / Plan: Induction of labor due to PROM,  progressing - will start pitocin  Labor: Progressing, will start pitocin Preeclampsia:  No S&S of PIH Fetal Wellbeing:  Category II Pain Control:  IV pain meds, up to shower, plan for epidural with pitocin administration I/D:  GBS neg Anticipated MOD:  NSVD  Orlie Pollen, CNM 09/20/2020, 1:14 PM

## 2020-09-20 NOTE — H&P (Signed)
H&P Chief complaint: leakage of fluid  Vanessa Vincent is an 19 y.o. female.  HPI: She presents to labor and delivery with complaints of leakage of fluid at 3:30 am. She reports a large gush of clear fluid. She has not been experiencing contractions. No vaginal bleeding. Normal fetal movement. Her pregnancy has been uncomplicated.     Clinic [facility] Prenatal Labs  Dating  8wk [place] Blood type: O/Positive/-- (04/19 1611)   Genetic Screen AFP:  NEG    NIPS:   Diploid XX Antibody:Negative (04/19 1611)  Anatomic [place]  normal female Rubella: 7.39 (04/19 1611)   Varicella: Immune  GTT  28 wk:   89   RPR: Non Reactive (09/10 1009)   Rhogam  not needed HBsAg: Negative (04/19 1611)   TDaP vaccine               07/28/20        HIV: Non Reactive (09/10 1009)   Flu Shot        07/14/20                        FWY:OVZCHYIF/-- (11/17 1215)  Contraception POP Pap: under 21  CBB  no   CS/VBAC n/a   Baby Food Breast   Support Person        Past Medical History:  Diagnosis Date  . Anxiety with depression   . Dehydration 05/09/2015  . 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 Hyponatremia 05/09/2015  . Lyme disease   . Migraine   . Orthodontics    has bracees  . Petechiae 05/09/2015  . Pneumonia 05/08/2015  . Pyelonephritis   . Reflux    as infant  . Thrombocytopenia ([place])     Past Surgical History:  Procedure Laterality Date  . SEPTOPLASTY N/A 07/23/2016   Procedure: SEPTOPLASTY;  Surgeon: Clyde Canterbury, MD;  Location: [facility];  Service: ENT;  Laterality: N/A;    Family History  Problem Relation Age of Onset  . Juvenile Rhematoid Arthritis Sister   . Lupus Mother   . Fibromyalgia Mother   . Hypertension Mother   . Breast cancer Maternal Grandmother   . Diabetes Sister   . Heart disease Maternal Aunt   . Cancer Maternal Aunt     Social History:  reports that she has never smoked. She has never used smokeless tobacco. She reports previous drug use. Drug:  Marijuana. She reports that she does not drink alcohol.  Allergies: No Known Allergies  Medications: I have reviewed the patient's current medications.  Results for orders placed or performed in visit on 09/18/20 (from the past 48 hour(s))  POC Urinalysis Dipstick OB     Status: Abnormal   Collection Time: 09/18/20 10:25 AM  Result Value Ref Range   Color, UA     Clarity, UA     Glucose, UA Negative Negative   Bilirubin, UA     Ketones, UA     Spec Grav, UA     Blood, UA     pH, UA     POC,PROTEIN,UA Trace Negative, Trace, Small (1+), Moderate (2+), Large (3+), 4+   Urobilinogen, UA     Nitrite, UA     Leukocytes, UA     Appearance     Odor      No results found.  Review of Systems  Constitutional: Negative for chills and fever.  HENT: Negative for congestion, hearing loss and sinus pain.   Respiratory: Negative  for cough, shortness of breath and wheezing.   Cardiovascular: Negative for chest pain, palpitations and leg swelling.  Gastrointestinal: Negative for abdominal pain, constipation, diarrhea, nausea and vomiting.  Genitourinary: Negative for dysuria, flank pain, frequency, hematuria and urgency.  Musculoskeletal: Negative for back pain.  Skin: Negative for rash.  Neurological: Negative for dizziness and headaches.  Psychiatric/Behavioral: Negative for suicidal ideas. The patient is not nervous/anxious.    Blood pressure 136/87, pulse (!) 104, temperature 98.1 F (36.7 C), temperature source Oral, resp. rate 17, height 5\' 5"  (1.651 m), weight 85.3 kg, last menstrual period 12/09/2019, unknown if currently breastfeeding. Physical Exam Vitals and nursing note reviewed.  Constitutional:      Appearance: She is well-developed.  HENT:     Head: Normocephalic and atraumatic.  Cardiovascular:     Rate and Rhythm: Normal rate and regular rhythm.  Pulmonary:     Effort: Pulmonary effort is normal.     Breath sounds: Normal breath sounds.  Abdominal:     General:  Bowel sounds are normal.     Palpations: Abdomen is soft.  Musculoskeletal:        General: Normal range of motion.  Skin:    General: Skin is warm and dry.  Neurological:     Mental Status: She is alert and oriented to person, place, and time.  Psychiatric:        Behavior: Behavior normal.        Thought Content: Thought content normal.        Judgment: Judgment normal.    NST: 150 bpm baseline, moderate variability, 15 x15 accelerations, no decelerations. Tocometer : quiet  SVE: 1/70/-2  Assessment/Plan: 19 yo with prelabor rupture of membranes Will begin induction of labor with cytotec GBS negative. Will obtain normal admission labs. Epidural when desires. Clear liquid diet unless otherwise specified.  Alyscia Carmon R Tanysha Quant 09/20/2020, 6:22 AM

## 2020-09-21 ENCOUNTER — Encounter: Payer: Self-pay | Admitting: Obstetrics and Gynecology

## 2020-09-21 LAB — CBC
HCT: 30.1 % — ABNORMAL LOW (ref 36.0–46.0)
Hemoglobin: 10.2 g/dL — ABNORMAL LOW (ref 12.0–15.0)
MCH: 31.9 pg (ref 26.0–34.0)
MCHC: 33.9 g/dL (ref 30.0–36.0)
MCV: 94.1 fL (ref 80.0–100.0)
Platelets: 114 10*3/uL — ABNORMAL LOW (ref 150–400)
RBC: 3.2 MIL/uL — ABNORMAL LOW (ref 3.87–5.11)
RDW: 12.9 % (ref 11.5–15.5)
WBC: 16.2 10*3/uL — ABNORMAL HIGH (ref 4.0–10.5)
nRBC: 0 % (ref 0.0–0.2)

## 2020-09-21 LAB — TYPE AND SCREEN
ABO/RH(D): O POS
Antibody Screen: NEGATIVE

## 2020-09-21 LAB — RPR: RPR Ser Ql: NONREACTIVE

## 2020-09-21 MED ORDER — IBUPROFEN 800 MG PO TABS
800.0000 mg | ORAL_TABLET | Freq: Three times a day (TID) | ORAL | Status: DC
  Administered 2020-09-21 – 2020-09-22 (×2): 800 mg via ORAL
  Filled 2020-09-21 (×2): qty 1

## 2020-09-21 NOTE — Addendum Note (Signed)
Addendum  created 09/21/20 0734 by Doreen Salvage, CRNA   Clinical Note Signed

## 2020-09-21 NOTE — Anesthesia Post-op Follow-up Note (Signed)
  Anesthesia Pain Follow-up Note  Patient: Vanessa Vincent  Day #: 1  Date of Follow-up: 09/21/2020 Time: 7:33 AM  Last Vitals:  Vitals:   09/21/20 0600 09/21/20 0700  BP:    Pulse: 81 79  Resp:    Temp:    SpO2: 98% 98%    Level of Consciousness: alert  Pain: none   Side Effects:None  Catheter Site Exam:clean, dry, no drainage     Plan: D/C from anesthesia care at surgeon's request  Alison Stalling

## 2020-09-21 NOTE — Lactation Note (Signed)
This note was copied from a baby's chart. Lactation Consultation Note  Patient Name: Vanessa Vincent [id number] Date: 09/21/2020 Reason for consult: Initial assessment;1st time breastfeeding;Term;Other (Comment) (c-section)  Initial lactation visit. Mom delivered via c/s 19hrs ago to full-term infant and is breastfeeding. Baby had initial good breastfeeds following delivery, but baby has been spitty and sleepy so far today. Voids and stools exceed minimum expectations, and when mom was taught hand expression copious amounts of gold rich colostrum was easily expressed. LC encouraged skin to skin, frequent hand expression and at least 8 attempts throughout this first 24 hours. Discussed the potential for onset of cluster feeding around 24 hr mark, and importance of putting to breast with all cues. Information given for newborn stomach size, feeding patterns and behaviors, early feeding cues, output expectations 24-48 hours, and anticipation of breast changes over the next 2-3 days, and management.  Mom had no additional questions; encouraged to call with questions or for BF support as needed.  Maternal Data Formula Feeding for Exclusion: No Has patient been taught Hand Expression?: Yes Does the patient have breastfeeding experience prior to this delivery?: No  Feeding Feeding Type: Breast Fed  LATCH Score                   Interventions Interventions: Breast feeding basics reviewed;Assisted with latch;Skin to skin;Hand express  Lactation Tools Discussed/Used     Consult Status Consult Status: Follow-up Date: 09/21/20 Follow-up type: Call as needed    Lavonia Drafts 09/21/2020, 2:03 PM

## 2020-09-21 NOTE — Anesthesia Postprocedure Evaluation (Signed)
Anesthesia Post Note  Patient: Manufacturing engineer  Procedure(s) Performed: [place]  Patient location during evaluation: Mother Baby Anesthesia Type: Epidural Level of consciousness: oriented and awake and alert Pain management: pain level controlled Vital Signs Assessment: post-procedure vital signs reviewed and stable Respiratory status: spontaneous breathing and respiratory function stable Cardiovascular status: blood pressure returned to baseline and stable Postop Assessment: no headache, no backache, no apparent nausea or vomiting and able to ambulate Anesthetic complications: no   No complications documented.   Last Vitals:  Vitals:   09/21/20 0600 09/21/20 0700  BP:    Pulse: 81 79  Resp:    Temp:    SpO2: 98% 98%    Last Pain:  Vitals:   09/21/20 0420  TempSrc:   PainSc: 0-No pain                 Alison Stalling

## 2020-09-21 NOTE — Progress Notes (Signed)
Obstetric Postpartum/PostOperative Daily Progress Note Subjective:  19 y.o. G1P1001 post-operative day # 1 status post primary cesarean delivery.  She is ambulating, is tolerating po, is not yet voiding spontaneously.  Her pain is well controlled on PO pain medications. Her lochia is less than menses. She reports breastfeeding is going well.   Medications SCHEDULED MEDICATIONS  . acetaminophen  1,000 mg Oral Q6H  . ibuprofen  800 mg Oral Q8H  . prenatal multivitamin  1 tablet Oral Q1200  . scopolamine  1 patch Transdermal Once  . senna-docusate  2 tablet Oral Q24H  . simethicone  80 mg Oral TID PC  . simethicone  80 mg Oral Q24H    MEDICATION INFUSIONS  . bupivacaine 0.25 % ON-Q pump DUAL CATH 400 mL    . lactated ringers    . naLOXone [facility]) adult infusion for PRURITIS    . oxytocin 2.5 Units/hr (09/20/20 2145)    PRN MEDICATIONS  coconut oil, witch hazel-glycerin **AND** dibucaine, diphenhydrAMINE, diphenhydrAMINE **OR** diphenhydrAMINE, ketorolac **OR** ketorolac, menthol-cetylpyridinium, nalbuphine **OR** nalbuphine, nalbuphine **OR** nalbuphine, naloxone **AND** sodium chloride flush, naLOXone (NARCAN) adult infusion for PRURITIS, ondansetron (ZOFRAN) IV, oxyCODONE-acetaminophen, simethicone    Objective:   Vitals:   09/21/20 0600 09/21/20 0700 09/21/20 0839 09/21/20 0900  BP:   101/65   Pulse: 81 79 76 82  Resp:   20   Temp:   98.4 F (36.9 C)   TempSrc:   Oral   SpO2: 98% 98% 99% 97%  Weight:      Height:        Current Vital Signs 24h Vital Sign Ranges  T 98.4 F (36.9 C) Temp  Avg: 98.2 F (36.8 C)  Min: 97.5 F (36.4 C)  Max: 99.5 F (37.5 C)  BP 101/65 BP  Min: 88/51  Max: 150/89  HR 82 Pulse  Avg: 93.9  Min: 69  Max: 113  RR 20 Resp  Avg: 18.3  Min: 13  Max: 27  SaO2 97 % Room Air SpO2  Avg: 97.3 %  Min: 91 %  Max: 100 %       24 Hour I/O Current Shift I/O  Time Ins Outs 12/01 0701 - 12/02 0700 In: 5571.4 [P.O.:250; I.V.:4932.8] Out: 3658  [Urine:3170] No intake/output data recorded.  General: NAD Pulmonary: no increased work of breathing Abdomen: non-distended, non-tender, fundus firm at level of umbilicus Inc: Clean/dry/intact, On Q pump with sero/sang drainage Extremities: no edema, no erythema, no tenderness  Labs:  Recent Labs  Lab 09/20/20 0702 09/21/20 0648  WBC 10.7* 16.2*  HGB 11.9* 10.2*  HCT 34.2* 30.1*  PLT 123* 114*     Assessment:   19 y.o. G1P1001 postoperative day # 1 status post primary cesarean section, lactating  Plan:  1) Acute blood loss anemia - hemodynamically stable and asymptomatic - po ferrous sulfate  2) O POS / Rubella 7.39 (04/19 1611)/ Varicella Immune  3) TDAP status UTD  4) breast /Contraception = oral progesterone-only contraceptive  5) Disposition: continue current care   Rod Can, CNM 09/21/2020 10:42 AM

## 2020-09-21 NOTE — Lactation Note (Signed)
This note was copied from a baby's chart. Lactation Consultation Note  Patient Name: Vanessa Vincent [id number] Date: 09/21/2020 Reason for consult: Follow-up assessment;Mother's request;1st time breastfeeding;Term;Other (Comment) (c/s; spitty)  Lactation in to assist with feeding. Parents report baby stayed content skin to skin w/ dad until just now when she started to cue. LC assisted with placement of support pillows for preferred football position, brought baby to mom and assisted with initial latch. Mom had initial discomfort, baby pulled in deeper and mom reported change. Baby displayed wide open mouth, large angle, flanged top/bottom lips and strong rhythmic sucking pattern with audible spontaneous swallows.  Mom was more at ease as the feeding progressed. We discussed feeding position, alignment, keeping baby deep on breast, support pillows, and use of coconut oil and comfort gels (explained alternating use, and rinsing of comfort gels) to help with nipple tenderness/soreness.  Discussed anticipated cluster feeding closer to 24 hour mark, output expectations going forward, and continued BF support throughout the night. Mom feeling more confident, but would like continued support.  Maternal Data Formula Feeding for Exclusion: No Has patient been taught Hand Expression?: Yes Does the patient have breastfeeding experience prior to this delivery?: No  Feeding Feeding Type: Breast Fed  LATCH Score Latch: Grasps breast easily, tongue down, lips flanged, rhythmical sucking.  Audible Swallowing: Spontaneous and intermittent  Type of Nipple: Everted at rest and after stimulation (baby does well everting the nipple)  Comfort (Breast/Nipple): Filling, red/small blisters or bruises, mild/mod discomfort (slight soreness per mom)  Hold (Positioning): Assistance needed to correctly position infant at breast and maintain latch.  LATCH Score: 8  Interventions Interventions: Breast feeding  basics reviewed;Assisted with latch;Skin to skin;Hand express;Adjust position;Support pillows;Coconut oil;Comfort gels  Lactation Tools Discussed/Used Tools: Comfort gels;Coconut oil   Consult Status Consult Status: Follow-up Date: 09/22/20 Follow-up type: In-patient    Lavonia Drafts 09/21/2020, 3:44 PM

## 2020-09-22 MED ORDER — OXYCODONE-ACETAMINOPHEN 5-325 MG PO TABS
1.0000 | ORAL_TABLET | ORAL | 0 refills | Status: AC | PRN
Start: 2020-09-22 — End: 2020-09-29

## 2020-09-22 MED ORDER — IBUPROFEN 800 MG PO TABS
800.0000 mg | ORAL_TABLET | Freq: Three times a day (TID) | ORAL | 0 refills | Status: DC
Start: 2020-09-22 — End: 2021-07-24

## 2020-09-22 NOTE — Lactation Note (Addendum)
This note was copied from a baby's chart. Lactation Consultation Note  Patient Name: Vanessa Vincent [id number] Date: 09/22/2020 Reason for consult: Follow-up assessment;Primapara;Term For discharge to home today  Maternal Data Formula Feeding for Exclusion: No Has patient been taught Hand Expression?: Yes Does the patient have breastfeeding experience prior to this delivery?: No Mom has blisters and scabbing on left nipple Feeding BAby eager to latch, mom shown how to first hand express drops of colostrum and evert nipple, shown how to shape left breast to get a deep latch, shown how to support breast and apply gentle pressure to jaw to widen latch and flange lips, in cradle hold, some nipple pain at first latch but then subsided with these measures, baby nursed well with little stimulation with swallows noted.  Same procedure practiced on right breast.  Baby was sleepy and calm after feeding.  Discouraged pacifier use until nipple pain better.  Mom can also pump breasts before latching a few minutes to elongate the nipple for deeper latch.  Dad shown how to assist mom with positioning baby at breast. Encouraged breastfeeding baby every 2-3 hrs or at least 8 times in 24 hrs.      LATCH Score Latch: Grasps breast easily, tongue down, lips flanged, rhythmical sucking.  Audible Swallowing: Spontaneous and intermittent  Type of Nipple: Flat (evert with stimulation and compression)  Comfort (Breast/Nipple): Filling, red/small blisters or bruises, mild/mod discomfort  Hold (Positioning): Assistance needed to correctly position infant at breast and maintain latch.  LATCH Score: 7  Interventions Interventions: Breast feeding basics reviewed;Assisted with latch;Skin to skin;Breast massage;Hand express;Breast compression;Adjust position;Support pillows;Coconut oil  Lactation Tools Discussed/Used WIC Program: Yes Reviewed breast milk storage and shown info. In baby care booklet.  [facility] name  and no  Written on white board Consult Status Consult Status: Complete Date: 09/22/20 Follow-up type: In-patient    Vanessa Vincent 09/22/2020, 12:32 PM

## 2020-09-22 NOTE — Progress Notes (Signed)
Pt discharged with infant. Discharge instructions, prescriptions, and follow up appointments given to and reviewed with patient. Pt verbalized understanding. To be escorted out by auxillary.  °

## 2020-09-22 NOTE — Discharge Instructions (Signed)
Postpartum Care After Cesarean Delivery This sheet gives you information about how to care for yourself from the time you deliver your baby to up to 6-12 weeks after delivery (postpartum period). Your health care provider may also give you more specific instructions. If you have problems or questions, contact your health care provider. Follow these instructions at home: Medicines  Take over-the-counter and prescription medicines only as told by your health care provider.  If you were prescribed an antibiotic medicine, take it as told by your health care provider. Do not stop taking the antibiotic even if you start to feel better.  Ask your health care provider if the medicine prescribed to you: ? Requires you to avoid driving or using heavy machinery. ? Can cause constipation. You may need to take actions to prevent or treat constipation, such as:  Drink enough fluid to keep your urine pale yellow.  Take over-the-counter or prescription medicines.  Eat foods that are high in fiber, such as beans, whole grains, and fresh fruits and vegetables.  Limit foods that are high in fat and processed sugars, such as fried or sweet foods. Activity  Gradually return to your normal activities as told by your health care provider.  Avoid activities that take a lot of effort and energy (are strenuous) until approved by your health care provider. Walking at a slow to moderate pace is usually safe. Ask your health care provider what activities are safe for you. ? Do not lift anything that is heavier than your baby or 10 lb (4.5 kg) as told by your health care provider. ? Do not vacuum, climb stairs, or drive a car for as long as told by your health care provider.  If possible, have someone help you at home until you are able to do your usual activities yourself.  Rest as much as possible. Try to rest or take naps while your baby is sleeping. Vaginal bleeding  It is normal to have vaginal bleeding  (lochia) after delivery. Wear a sanitary pad to absorb vaginal bleeding and discharge. ? During the first week after delivery, the amount and appearance of lochia is often similar to a menstrual period. ? Over the next few weeks, it will gradually decrease to a dry, yellow-brown discharge. ? For most women, lochia stops completely by 4-6 weeks after delivery. Vaginal bleeding can vary from woman to woman.  Change your sanitary pads frequently. Watch for any changes in your flow, such as: ? A sudden increase in volume. ? A change in color. ? Large blood clots.  If you pass a blood clot, save it and call your health care provider to discuss. Do not flush blood clots down the toilet before you get instructions from your health care provider.  Do not use tampons or douches until your health care provider says this is safe.  If you are not breastfeeding, your period should return 6-8 weeks after delivery. If you are breastfeeding, your period may return anytime between 8 weeks after delivery and the time that you stop breastfeeding. Perineal care   If your C-section (Cesarean section) was unplanned, and you were allowed to labor and push before delivery, you may have pain, swelling, and discomfort of the tissue between your vaginal opening and your anus (perineum). You may also have an incision in the tissue (episiotomy) or the tissue may have torn during delivery. Follow these instructions as told by your health care provider: ? Keep your perineum clean and dry as told by  your health care provider. Use medicated pads and pain-relieving sprays and creams as directed. ? If you have an episiotomy or vaginal tear, check the area every day for signs of infection. Check for:  Redness, swelling, or pain.  Fluid or blood.  Warmth.  Pus or a bad smell. ? You may be given a squirt bottle to use instead of wiping to clean the perineum area after you go to the bathroom. As you start healing, you may use  the squirt bottle before wiping yourself. Make sure to wipe gently. ? To relieve pain caused by an episiotomy, vaginal tear, or hemorrhoids, try taking a warm sitz bath 2-3 times a day. A sitz bath is a warm water bath that is taken while you are sitting down. The water should only come up to your hips and should cover your buttocks. Breast care  Within the first few days after delivery, your breasts may feel heavy, full, and uncomfortable (breast engorgement). You may also have milk leaking from your breasts. Your health care provider can suggest ways to help relieve breast discomfort. Breast engorgement should go away within a few days.  If you are breastfeeding: ? Wear a bra that supports your breasts and fits you well. ? Keep your nipples clean and dry. Apply creams and ointments as told by your health care provider. ? You may need to use breast pads to absorb milk leakage. ? You may have uterine contractions every time you breastfeed for several weeks after delivery. Uterine contractions help your uterus return to its normal size. ? If you have any problems with breastfeeding, work with your health care provider or a Science writer.  If you are not breastfeeding: ? Avoid touching your breasts as this can make your breasts produce more milk. ? Wear a well-fitting bra and use cold packs to help with swelling. ? Do not squeeze out (express) milk. This causes you to make more milk. Intimacy and sexuality  Ask your health care provider when you can engage in sexual activity. This may depend on your: ? Risk of infection. ? Healing rate. ? Comfort and desire to engage in sexual activity.  You are able to get pregnant after delivery, even if you have not had your period. If desired, talk with your health care provider about methods of family planning or birth control (contraception). Lifestyle  Do not use any products that contain nicotine or tobacco, such as cigarettes, e-cigarettes,  and chewing tobacco. If you need help quitting, ask your health care provider.  Do not drink alcohol, especially if you are breastfeeding. Eating and drinking   Drink enough fluid to keep your urine pale yellow.  Eat high-fiber foods every day. These may help prevent or relieve constipation. High-fiber foods include: ? Whole grain cereals and breads. ? Brown rice. ? Beans. ? Fresh fruits and vegetables.  Take your prenatal vitamins until your postpartum checkup or until your health care provider tells you it is okay to stop. General instructions  Keep all follow-up visits for you and your baby as told by your health care provider. Most women visit their health care provider for a postpartum checkup within the first 3-6 weeks after delivery. Contact a health care provider if you:  Feel unable to cope with the changes that a new baby brings to your life, and these feelings do not go away.  Feel unusually sad or worried.  Have breasts that are painful, hard, or turn red.  Have a fever.  Have trouble holding urine or keeping urine from leaking.  Have little or no interest in activities you used to enjoy.  Have not breastfed at all and you have not had a menstrual period for 12 weeks after delivery.  Have stopped breastfeeding and you have not had a menstrual period for 12 weeks after you stopped breastfeeding.  Have questions about caring for yourself or your baby.  Pass a blood clot from your vagina. Get help right away if you:  Have chest pain.  Have difficulty breathing.  Have sudden, severe leg pain.  Have severe pain or cramping in your abdomen.  Bleed from your vagina so much that you fill more than one sanitary pad in one hour. Bleeding should not be heavier than your heaviest period.  Develop a severe headache.  Faint.  Have blurred vision or spots in your vision.  Have a bad-smelling vaginal discharge.  Have thoughts about hurting yourself or your  baby. If you ever feel like you may hurt yourself or others, or have thoughts about taking your own life, get help right away. You can go to your nearest emergency department or call:  Your local emergency services (911 in the [place]  A suicide crisis helpline, such as the [place] at (819)651-4255. This is open 24 hours a day. Summary  The period of time from when you deliver your baby to up to 6-12 weeks after delivery is called the postpartum period.  Gradually return to your normal activities as told by your health care provider.  Keep all follow-up visits for you and your baby as told by your health care provider. This information is not intended to replace advice given to you by your health care provider. Make sure you discuss any questions you have with your health care provider. Document Revised: 05/27/2018 Document Reviewed: 05/27/2018 Elsevier Patient Education  2020 [employer]. Postpartum Baby Blues The postpartum period begins right after the birth of a baby. During this time, there is often a lot of joy and excitement. It is also a time of many changes in the life of the parents. No matter how many times a mother gives birth, each child brings new challenges to the family, including different ways of relating to one another. It is common to have feelings of excitement along with confusing changes in moods, emotions, and thoughts. You may feel happy one minute and sad or stressed the next. These feelings of sadness usually happen in the period right after you have your baby, and they go away within a week or two. This is called the "baby blues." What are the causes? There is no known cause of baby blues. It is likely caused by a combination of factors. However, changes in hormone levels after childbirth are believed to trigger some of the symptoms. Other factors that can play a role in these mood changes include:  Lack of sleep.  Stressful life  events, such as poverty, caring for a loved one, or death of a loved one.  Genetics. What are the signs or symptoms? Symptoms of this condition include:  Brief changes in mood, such as going from extreme happiness to sadness.  Decreased concentration.  Difficulty sleeping.  Crying spells and tearfulness.  Loss of appetite.  Irritability.  Anxiety. If the symptoms of baby blues last for more than 2 weeks or become more severe, you may have postpartum depression. How is this diagnosed? This condition is diagnosed based on an evaluation of your  symptoms. There are no medical or lab tests that lead to a diagnosis, but there are various questionnaires that a health care provider may use to identify women with the baby blues or postpartum depression. How is this treated? Treatment is not needed for this condition. The baby blues usually go away on their own in 1-2 weeks. Social support is often all that is needed. You will be encouraged to get adequate sleep and rest. Follow these instructions at home: Lifestyle      Get as much rest as you can. Take a nap when the baby sleeps.  Exercise regularly as told by your health care provider. Some women find yoga and walking to be helpful.  Eat a balanced and nourishing diet. This includes plenty of fruits and vegetables, whole grains, and lean proteins.  Do little things that you enjoy. Have a cup of tea, take a bubble bath, read your favorite magazine, or listen to your favorite music.  Avoid alcohol.  Ask for help with household chores, cooking, grocery shopping, or running errands. Do not try to do everything yourself. Consider hiring a postpartum doula to help. This is a professional who specializes in providing support to new mothers.  Try not to make any major life changes during pregnancy or right after giving birth. This can add stress. General instructions  Talk to people close to you about how you are feeling. Get support  from your partner, family members, friends, or other new moms. You may want to join a support group.  Find ways to cope with stress. This may include: ? Writing your thoughts and feelings in a journal. ? Spending time outside. ? Spending time with people who make you laugh.  Try to stay positive in how you think. Think about the things you are grateful for.  Take over-the-counter and prescription medicines only as told by your health care provider.  Let your health care provider know if you have any concerns.  Keep all postpartum visits as told by your health care provider. This is important. Contact a health care provider if:  Your baby blues do not go away after 2 weeks. Get help right away if:  You have thoughts of taking your own life (suicidal thoughts).  You think you may harm the baby or other people.  You see or hear things that are not there (hallucinations). Summary  After giving birth, you may feel happy one minute and sad or stressed the next. Feelings of sadness that happen right after the baby is born and go away after a week or two are called the "baby blues."  You can manage the baby blues by getting enough rest, eating a healthy diet, exercising, spending time with supportive people, and finding ways to cope with stress.  If feelings of sadness and stress last longer than 2 weeks or get in the way of caring for your baby, talk to your health care provider. This may mean you have postpartum depression. This information is not intended to replace advice given to you by your health care provider. Make sure you discuss any questions you have with your health care provider. Document Revised: 01/29/2019 Document Reviewed: 12/03/2016 Elsevier Patient Education  [place]. Breastfeeding Tips for a Good Latch Latching is how your baby's mouth attaches to your nipple to breastfeed. It is an important part of breastfeeding. Your baby may have trouble latching for a  number of reasons. A poor latch may cause you to have cracked or  sore nipples or other problems. Follow these instructions at home: How to position your baby  Find a comfortable place to sit or lie down. Your neck and back should be well supported.  If you are seated, place a pillow or rolled-up blanket under your baby. This will bring him or her to the level of your breast.  Make sure that your baby's belly (abdomen) is facing your belly.  Try different positions to find one that works best for you and your baby. How to help your baby latch   To start, gently rub your breast. Move your fingertips in a circle as you massage from your chest wall toward your nipple. This helps milk flow. Keep doing this during feeding if needed.  Position your breast. Hold your breast with four fingers underneath and your thumb above your nipple. Keep your fingers away from your nipple and your baby's mouth. Follow these steps to help your baby latch: 1. Rub your baby's lips gently with your finger or nipple. 2. When your baby's mouth is open wide enough, quickly bring your baby to your breast and place your whole nipple into your baby's mouth. Place as much of the colored area around your nipple (areola)as possible into your baby's mouth. 3. Your baby's tongue should be between his or her lower gum and your breast. 4. You should be able to see more areola above your baby's upper lip than below the lower lip. 5. When your baby starts sucking, you will feel a gentle pull on your nipple. You should not feel any pain. Be patient. It is common for a baby to suck for about 2-3 minutes to start the flow of breast milk. 6. Make sure that your baby's mouth is in the right position around your nipple. Your baby's lips should make a seal on your breast and be turned outward.  General instructions  Look for these signs that your baby has latched on to your nipple: ? The baby is quietly tugging or sucking without  causing you pain. ? You hear the baby swallow after every 3 or 4 sucks. ? You see movement above and in front of the baby's ears while he or she is sucking.  Be aware of these signs that your baby has not latched on to your nipple: ? The baby makes sucking sounds or smacking sounds while feeding. ? You have nipple pain.  If your baby is not latched well, put your little finger between your baby's gums and your nipple. This will break the seal. Then try to help your baby latch again.  If you keep having problems, get help from a breastfeeding specialist (Science writer). Contact a doctor if:  You have cracking or soreness in your nipples that lasts longer than 1 week.  You have nipple pain.  Your breasts are filled with too much milk (engorgement), and this does not improve after 48-72 hours.  You have a plugged milk duct and a fever.  You follow the tips for a good latch but you keep having problems or concerns.  You have a pus-like fluid coming from your breast.  Your baby is not gaining weight.  Your baby loses weight. Summary  Latching is how your baby's mouth attaches to your nipple to breastfeed.  Try different positions for breastfeeding to find one that works best for you and your baby.  A poor latch may cause you to have cracked or sore nipples or other problems. This information is not intended  to replace advice given to you by your health care provider. Make sure you discuss any questions you have with your health care provider. Document Revised: 01/27/2019 Document Reviewed: 05/14/2017 Elsevier Patient Education  [place]. Breastfeeding  Choosing to breastfeed is one of the best decisions you can make for yourself and your baby. A change in hormones during pregnancy causes your breasts to make breast milk in your milk-producing glands. Hormones prevent breast milk from being released before your baby is born. They also prompt milk flow after birth.  Once breastfeeding has begun, thoughts of your baby, as well as his or her sucking or crying, can stimulate the release of milk from your milk-producing glands. Benefits of breastfeeding Research shows that breastfeeding offers many health benefits for infants and mothers. It also offers a cost-free and convenient way to feed your baby. For your baby  Your first milk (colostrum) helps your baby's digestive system to function better.  Special cells in your milk (antibodies) help your baby to fight off infections.  Breastfed babies are less likely to develop asthma, allergies, obesity, or type 2 diabetes. They are also at lower risk for sudden infant death syndrome (SIDS).  Nutrients in breast milk are better able to meet your babys needs compared to infant formula.  Breast milk improves your baby's brain development. For you  Breastfeeding helps to create a very special bond between you and your baby.  Breastfeeding is convenient. Breast milk costs nothing and is always available at the correct temperature.  Breastfeeding helps to burn calories. It helps you to lose the weight that you gained during pregnancy.  Breastfeeding makes your uterus return faster to its size before pregnancy. It also slows bleeding (lochia) after you give birth.  Breastfeeding helps to lower your risk of developing type 2 diabetes, osteoporosis, rheumatoid arthritis, cardiovascular disease, and breast, ovarian, uterine, and endometrial cancer later in life. Breastfeeding basics Starting breastfeeding  Find a comfortable place to sit or lie down, with your neck and back well-supported.  Place a pillow or a rolled-up blanket under your baby to bring him or her to the level of your breast (if you are seated). Nursing pillows are specially designed to help support your arms and your baby while you breastfeed.  Make sure that your baby's tummy (abdomen) is facing your abdomen.  Gently massage your breast. With  your fingertips, massage from the outer edges of your breast inward toward the nipple. This encourages milk flow. If your milk flows slowly, you may need to continue this action during the feeding.  Support your breast with 4 fingers underneath and your thumb above your nipple (make the letter "C" with your hand). Make sure your fingers are well away from your nipple and your babys mouth.  Stroke your baby's lips gently with your finger or nipple.  When your baby's mouth is open wide enough, quickly bring your baby to your breast, placing your entire nipple and as much of the areola as possible into your baby's mouth. The areola is the colored area around your nipple. ? More areola should be visible above your baby's upper lip than below the lower lip. ? Your baby's lips should be opened and extended outward (flanged) to ensure an adequate, comfortable latch. ? Your baby's tongue should be between his or her lower gum and your breast.  Make sure that your baby's mouth is correctly positioned around your nipple (latched). Your baby's lips should create a seal on your breast  and be turned out (everted).  It is common for your baby to suck about 2-3 minutes in order to start the flow of breast milk. Latching Teaching your baby how to latch onto your breast properly is very important. An improper latch can cause nipple pain, decreased milk supply, and poor weight gain in your baby. Also, if your baby is not latched onto your nipple properly, he or she may swallow some air during feeding. This can make your baby fussy. Burping your baby when you switch breasts during the feeding can help to get rid of the air. However, teaching your baby to latch on properly is still the best way to prevent fussiness from swallowing air while breastfeeding. Signs that your baby has successfully latched onto your nipple  Silent tugging or silent sucking, without causing you pain. Infant's lips should be extended outward  (flanged).  Swallowing heard between every 3-4 sucks once your milk has started to flow (after your let-down milk reflex occurs).  Muscle movement above and in front of his or her ears while sucking. Signs that your baby has not successfully latched onto your nipple  Sucking sounds or smacking sounds from your baby while breastfeeding.  Nipple pain. If you think your baby has not latched on correctly, slip your finger into the corner of your babys mouth to break the suction and place it between your baby's gums. Attempt to start breastfeeding again. Signs of successful breastfeeding Signs from your baby  Your baby will gradually decrease the number of sucks or will completely stop sucking.  Your baby will fall asleep.  Your baby's body will relax.  Your baby will retain a small amount of milk in his or her mouth.  Your baby will let go of your breast by himself or herself. Signs from you  Breasts that have increased in firmness, weight, and size 1-3 hours after feeding.  Breasts that are softer immediately after breastfeeding.  Increased milk volume, as well as a change in milk consistency and color by the fifth day of breastfeeding.  Nipples that are not sore, cracked, or bleeding. Signs that your baby is getting enough milk  Wetting at least 1-2 diapers during the first 24 hours after birth.  Wetting at least 5-6 diapers every 24 hours for the first week after birth. The urine should be clear or pale yellow by the age of 5 days.  Wetting 6-8 diapers every 24 hours as your baby continues to grow and develop.  At least 3 stools in a 24-hour period by the age of 5 days. The stool should be soft and yellow.  At least 3 stools in a 24-hour period by the age of 7 days. The stool should be seedy and yellow.  No loss of weight greater than 10% of birth weight during the first 3 days of life.  Average weight gain of 4-7 oz (113-198 g) per week after the age of 4  days.  Consistent daily weight gain by the age of 5 days, without weight loss after the age of 2 weeks. After a feeding, your baby may spit up a small amount of milk. This is normal. Breastfeeding frequency and duration Frequent feeding will help you make more milk and can prevent sore nipples and extremely full breasts (breast engorgement). Breastfeed when you feel the need to reduce the fullness of your breasts or when your baby shows signs of hunger. This is called "breastfeeding on demand." Signs that your baby is hungry include:  Increased alertness, activity, or restlessness.  Movement of the head from side to side.  Opening of the mouth when the corner of the mouth or cheek is stroked (rooting).  Increased sucking sounds, smacking lips, cooing, sighing, or squeaking.  Hand-to-mouth movements and sucking on fingers or hands.  Fussing or crying. Avoid introducing a pacifier to your baby in the first 4-6 weeks after your baby is born. After this time, you may choose to use a pacifier. Research has shown that pacifier use during the first year of a baby's life decreases the risk of sudden infant death syndrome (SIDS). Allow your baby to feed on each breast as long as he or she wants. When your baby unlatches or falls asleep while feeding from the first breast, offer the second breast. Because newborns are often sleepy in the first few weeks of life, you may need to awaken your baby to get him or her to feed. Breastfeeding times will vary from baby to baby. However, the following rules can serve as a guide to help you make sure that your baby is properly fed:  Newborns (babies 72 weeks of age or younger) may breastfeed every 1-3 hours.  Newborns should not go without breastfeeding for longer than 3 hours during the day or 5 hours during the night.  You should breastfeed your baby a minimum of 8 times in a 24-hour period. Breast milk pumping     Pumping and storing breast milk allows  you to make sure that your baby is exclusively fed your breast milk, even at times when you are unable to breastfeed. This is especially important if you go back to work while you are still breastfeeding, or if you are not able to be present during feedings. Your lactation consultant can help you find a method of pumping that works best for you and give you guidelines about how long it is safe to store breast milk. Caring for your breasts while you breastfeed Nipples can become dry, cracked, and sore while breastfeeding. The following recommendations can help keep your breasts moisturized and healthy:  Avoid using soap on your nipples.  Wear a supportive bra designed especially for nursing. Avoid wearing underwire-style bras or extremely tight bras (sports bras).  Air-dry your nipples for 3-4 minutes after each feeding.  Use only cotton bra pads to absorb leaked breast milk. Leaking of breast milk between feedings is normal.  Use lanolin on your nipples after breastfeeding. Lanolin helps to maintain your skin's normal moisture barrier. Pure lanolin is not harmful (not toxic) to your baby. You may also hand express a few drops of breast milk and gently massage that milk into your nipples and allow the milk to air-dry. In the first few weeks after giving birth, some women experience breast engorgement. Engorgement can make your breasts feel heavy, warm, and tender to the touch. Engorgement peaks within 3-5 days after you give birth. The following recommendations can help to ease engorgement:  Completely empty your breasts while breastfeeding or pumping. You may want to start by applying warm, moist heat (in the shower or with warm, water-soaked hand towels) just before feeding or pumping. This increases circulation and helps the milk flow. If your baby does not completely empty your breasts while breastfeeding, pump any extra milk after he or she is finished.  Apply ice packs to your breasts  immediately after breastfeeding or pumping, unless this is too uncomfortable for you. To do this: ? Put ice in a plastic bag. ?  Place a towel between your skin and the bag. ? Leave the ice on for 20 minutes, 2-3 times a day.  Make sure that your baby is latched on and positioned properly while breastfeeding. If engorgement persists after 48 hours of following these recommendations, contact your health care provider or a Science writer. Overall health care recommendations while breastfeeding  Eat 3 healthy meals and 3 snacks every day. Well-nourished mothers who are breastfeeding need an additional 450-500 calories a day. You can meet this requirement by increasing the amount of a balanced diet that you eat.  Drink enough water to keep your urine pale yellow or clear.  Rest often, relax, and continue to take your prenatal vitamins to prevent fatigue, stress, and low vitamin and mineral levels in your body (nutrient deficiencies).  Do not use any products that contain nicotine or tobacco, such as cigarettes and e-cigarettes. Your baby may be harmed by chemicals from cigarettes that pass into breast milk and exposure to secondhand smoke. If you need help quitting, ask your health care provider.  Avoid alcohol.  Do not use illegal drugs or marijuana.  Talk with your health care provider before taking any medicines. These include over-the-counter and prescription medicines as well as vitamins and herbal supplements. Some medicines that may be harmful to your baby can pass through breast milk.  It is possible to become pregnant while breastfeeding. If birth control is desired, ask your health care provider about options that will be safe while breastfeeding your baby. Where to find more information: [employer]: www.llli.org Contact a health care provider if:  You feel like you want to stop breastfeeding or have become frustrated with breastfeeding.  Your nipples are  cracked or bleeding.  Your breasts are red, tender, or warm.  You have: ? Painful breasts or nipples. ? A swollen area on either breast. ? A fever or chills. ? Nausea or vomiting. ? Drainage other than breast milk from your nipples.  Your breasts do not become full before feedings by the fifth day after you give birth.  You feel sad and depressed.  Your baby is: ? Too sleepy to eat well. ? Having trouble sleeping. ? More than 49 week old and wetting fewer than 6 diapers in a 24-hour period. ? Not gaining weight by 93 days of age.  Your baby has fewer than 3 stools in a 24-hour period.  Your baby's skin or the white parts of his or her eyes become yellow. Get help right away if:  Your baby is overly tired (lethargic) and does not want to wake up and feed.  Your baby develops an unexplained fever. Summary  Breastfeeding offers many health benefits for infant and mothers.  Try to breastfeed your infant when he or she shows early signs of hunger.  Gently tickle or stroke your baby's lips with your finger or nipple to allow the baby to open his or her mouth. Bring the baby to your breast. Make sure that much of the areola is in your baby's mouth. Offer one side and burp the baby before you offer the other side.  Talk with your health care provider or lactation consultant if you have questions or you face problems as you breastfeed. This information is not intended to replace advice given to you by your health care provider. Make sure you discuss any questions you have with your health care provider. Document Revised: 01/01/2018 Document Reviewed: 11/08/2016 Elsevier Patient Education  [place]  Pumping Tips Breast pumping is a way to get milk out of your breasts. You will then store the milk for your baby to use when you are away from home. There are three ways to pump. You can:  Use your hand to massage and squeeze your breast (hand expression).  Use a  hand-held machine to manually pump your milk.  Use an electric machine to pump your milk. In the beginning you may not get much milk. After a few days your breasts should make more. Pumping can help you start making milk after your baby is born. Pumping helps you to keep making milk when you are away from your baby. When should I pump? You can start pumping soon after your baby is born. Follow these tips:  When you are with your baby: ? Pump after you breastfeed. ? Pump from the free breast while you breastfeed.  When you are away from your baby: ? Pump every 2-3 hours for 15 minutes. ? Pump both breasts at the same time if you can.  If your baby drinks formula, pump around the time your baby gets the formula.  If you drank alcohol, wait 2 hours before you pump.  If you are going to have surgery, ask your doctor when you should pump again. How do I get ready to pump? Take steps to relax. Try these things to help your milk come in:  Smell your baby's blanket or clothes.  Look at a picture or video of your baby.  Sit in a quiet, private space.  Massage your breast and nipple.  Place a cloth on your breast. The cloth should be warm and a little wet.  Play relaxing music.  Picture your milk flowing. What are some tips? General tips for pumping breast milk   Always wash your hands before pumping.  If you do not get much milk or if pumping hurts, try different pump settings or a different kind of pump.  Drink enough fluid so your pee (urine) is clear or pale yellow.  Wear clothing that opens in the front or is easy to take off.  Pump milk into a clean bottle or container.  Do not use anything that has nicotine or tobacco. Examples are cigarettes and e-cigarettes. If you need help quitting, ask your doctor. Tips for storing breast milk   Store breast milk in a clean, BPA-free container. These include: ? A glass or plastic bottle. ? A milk storage bag.  Store only  2-4 ounces of breast milk in each container.  Swirl the breast milk in the container. Do not shake it.  Write down the date you pumped the milk on the container.  This is how long you can store breast milk: ? Room temperature: 6-8 hours. It is best to use the milk within 4 hours. ? Cooler with ice packs: 24 hours. ? Refrigerator: 5-8 days, if the milk is clean. It is best to use the milk within 3 days. ? Freezer: 9-12 months, if the milk is clean and stored away from the freezer door. It is best to use the milk within 6 months.  Put milk in the back of the refrigerator or freezer.  Thaw frozen milk using warm water. Do not use the microwave. Tips for choosing a breast pump When choosing a pump, keep the following things in mind:  Manual breast pumps do not need electricity. They cost less. They can be hard to use.  Electric breast pumps use electricity. They  are more expensive. They are easier to use. They collect more milk.  The suction cup (flange) should be the right size.  Before you buy the pump, check if your insurance will pay for it. Tips for caring for a breast pump  Check the manual that came with your pump for cleaning tips.  Clean the pump after you use it. To do this: ? Wipe down the electrical part. Use a dry cloth or paper towel. Do not put this part in water or in cleaning products. ? Wash the plastic parts with soap and warm water. Or use the dishwasher if the manual says it is safe. You do not need to clean the tubing unless it touched breast milk. ? Let all the parts air dry. Avoid drying them with a cloth or towel. ? When the parts are clean and dry, put the pump back together. Then store the pump.  If there is water in the tubing when you want to pump: 1. Attach the tubing to the pump. 2. Turn on the pump. 3. Turn off the pump when the tube is dry.  Try not to touch the inside of pump parts. Summary  Pumping can help you start making milk after your  baby is born. It lets you keep making milk when you are away from your baby.  When you are away from your baby, pump for about 15 minutes every 2-3 hours. Pump both breasts at the same time, if you can. This information is not intended to replace advice given to you by your health care provider. Make sure you discuss any questions you have with your health care provider. Document Revised: 01/27/2019 Document Reviewed: 11/11/2016 Elsevier Patient Education  2020 [employer].

## 2020-09-27 ENCOUNTER — Encounter: Payer: Medicaid Other | Admitting: Obstetrics and Gynecology

## 2020-09-27 ENCOUNTER — Inpatient Hospital Stay: Admit: 2020-09-27 | Payer: Self-pay

## 2020-09-29 ENCOUNTER — Ambulatory Visit (INDEPENDENT_AMBULATORY_CARE_PROVIDER_SITE_OTHER): Payer: Medicaid Other | Admitting: Obstetrics and Gynecology

## 2020-09-29 ENCOUNTER — Encounter: Payer: Self-pay | Admitting: Obstetrics and Gynecology

## 2020-09-29 ENCOUNTER — Other Ambulatory Visit: Payer: Self-pay

## 2020-09-29 VITALS — BP 118/72 | Ht 65.0 in | Wt 168.0 lb

## 2020-09-29 DIAGNOSIS — Z4889 Encounter for other specified surgical aftercare: Secondary | ICD-10-CM

## 2020-09-29 MED ORDER — NORETHINDRONE 0.35 MG PO TABS: 1.0000 | ORAL_TABLET | Freq: Every day | ORAL | 11 refills | Status: DC

## 2020-09-29 NOTE — Progress Notes (Signed)
Postoperative Follow-up Patient presents post op from 1LTCS 1weeks ago for fetal intolerance to labor.  Subjective: Patient reports marked improvement in her preop symptoms. Eating a regular diet without difficulty. Pain is controlled with current analgesics. Medications being used: prescription NSAID's including ibuprofen and narcotic analgesics including Percocet.  Activity: normal activities of daily living.  Objective: Vitals: Blood pressure 118/72, height 5\' 5"  (1.651 m), weight 168 lb (76.2 kg), unknown if currently breastfeeding.   General: NAD Pulmonary: no increased work of breathing Abdomen: soft, non-tender, non-distended, incision D/C/I Extremities: no edema Neurologic: normal gait  Admission on 09/20/2020, Discharged on 09/22/2020  Component Date Value Ref Range Status  . SARS Coronavirus 2 by RT PCR 09/20/2020 NEGATIVE  NEGATIVE Final   Comment: (NOTE) SARS-CoV-2 target nucleic acids are NOT DETECTED.  The SARS-CoV-2 RNA is generally detectable in upper respiratory specimens during the acute phase of infection. The lowest concentration of SARS-CoV-2 viral copies this assay can detect is 138 copies/mL. A negative result does not preclude SARS-Cov-2 infection and should not be used as the sole basis for treatment or other patient management decisions. A negative result may occur with  improper specimen collection/handling, submission of specimen other than nasopharyngeal swab, presence of viral mutation(s) within the areas targeted by this assay, and inadequate number of viral copies(<138 copies/mL). A negative result must be combined with clinical observations, patient history, and epidemiological information. The expected result is Negative.  Fact Sheet for Patients:  EntrepreneurPulse.com.au  Fact Sheet for Healthcare Providers:  IncredibleEmployment.be  This test is no                          t yet approved or  cleared by the [place] FDA and  has been authorized for detection and/or diagnosis of SARS-CoV-2 by FDA under an Emergency Use Authorization (EUA). This EUA will remain  in effect (meaning this test can be used) for the duration of the COVID-19 declaration under Section 564(b)(1) of the Act, 21 U.S.C.section 360bbb-3(b)(1), unless the authorization is terminated  or revoked sooner.      . Influenza A by PCR 09/20/2020 NEGATIVE  NEGATIVE Final  . Influenza B by PCR 09/20/2020 NEGATIVE  NEGATIVE Final   Comment: (NOTE) The Xpert Xpress SARS-CoV-2/FLU/RSV plus assay is intended as an aid in the diagnosis of influenza from Nasopharyngeal swab specimens and should not be used as a sole basis for treatment. Nasal washings and aspirates are unacceptable for Xpert Xpress SARS-CoV-2/FLU/RSV testing.  Fact Sheet for Patients: EntrepreneurPulse.com.au  Fact Sheet for Healthcare Providers: IncredibleEmployment.be  This test is not yet approved or cleared by the [place] FDA and has been authorized for detection and/or diagnosis of SARS-CoV-2 by FDA under an Emergency Use Authorization (EUA). This EUA will remain in effect (meaning this test can be used) for the duration of the COVID-19 declaration under Section 564(b)(1) of the Act, 21 U.S.C. section 360bbb-3(b)(1), unless the authorization is terminated or revoked.  Performed at [facility], [address], [place],  [address]   . WBC 09/20/2020 10.7* 4.0 - 10.5 K/uL Final  . RBC 09/20/2020 3.67* 3.87 - 5.11 MIL/uL Final  . Hemoglobin 09/20/2020 11.9* 12.0 - 15.0 g/dL Final  . HCT 09/20/2020 34.2* 36.0 - 46.0 % Final  . MCV 09/20/2020 93.2  80.0 - 100.0 fL Final  . MCH 09/20/2020 32.4  26.0 - 34.0 pg Final  . MCHC 09/20/2020 34.8  30.0 - 36.0  g/dL Final  . RDW 09/20/2020 13.1  11.5 - 15.5 % Final  . Platelets 09/20/2020 123* 150 - 400 K/uL Final  . nRBC 09/20/2020 0.0   0.0 - 0.2 % Final   Performed at [facility], [address] [place], [place] [address]  . ABO/RH(D) 09/20/2020 O POS   Final  . Antibody Screen 09/20/2020 NEG   Final  . Sample Expiration 09/20/2020    Final                   Value:09/23/2020,2359 Performed at [facility], [address] [place], [place] [address]   . RPR Ser Ql 09/20/2020 NON REACTIVE  NON REACTIVE Final   Performed at [facility], [address] [place], [place] [address]  . HIV-1 P24 Antigen - HIV24 09/20/2020 NON REACTIVE  NON REACTIVE Final   Comment: (NOTE) Detection of p24 may be inhibited by biotin in the sample, causing false negative results in acute infection.   Marland Kitchen HIV 1/2 Antibodies 09/20/2020 NON REACTIVE  NON REACTIVE Final  . Interpretation (HIV Ag Ab) 09/20/2020 A non reactive test result means that HIV 1 or HIV 2 antibodies and HIV 1 p24 antigen were not detected in the specimen.   Final   Performed at [facility], [address], [place], [place] [address]  . WBC 09/21/2020 16.2* 4.0 - 10.5 K/uL Final  . RBC 09/21/2020 3.20* 3.87 - 5.11 MIL/uL Final  . Hemoglobin 09/21/2020 10.2* 12.0 - 15.0 g/dL Final  . HCT 09/21/2020 30.1* 36.0 - 46.0 % Final  . MCV 09/21/2020 94.1  80.0 - 100.0 fL Final  . MCH 09/21/2020 31.9  26.0 - 34.0 pg Final  . MCHC 09/21/2020 33.9  30.0 - 36.0 g/dL Final  . RDW 09/21/2020 12.9  11.5 - 15.5 % Final  . Platelets 09/21/2020 114* 150 - 400 K/uL Final   Comment: Immature Platelet Fraction may be clinically indicated, consider ordering this additional test [id number]   . nRBC 09/21/2020 0.0  0.0 - 0.2 % Final   Performed at [facility], [address] [place], [place] [address]    Assessment: 19 y.o. s/p 1LTCS stable  Plan: Patient has done well after surgery with no apparent complications.  I have discussed the post-operative course to date, and the expected progress moving forward.  The patient understands what  complications to be concerned about.  I will see the patient in routine follow up, or sooner if needed.    Activity plan: No heavy lifting.  Rx norethindrone, continues to breast feed   Malachy Mood, MD, [place], [facility] 09/29/2020, 9:55 AM

## 2020-11-03 ENCOUNTER — Other Ambulatory Visit: Payer: Self-pay

## 2020-11-03 ENCOUNTER — Ambulatory Visit (INDEPENDENT_AMBULATORY_CARE_PROVIDER_SITE_OTHER): Payer: Medicaid Other | Admitting: Obstetrics and Gynecology

## 2020-11-03 ENCOUNTER — Encounter: Payer: Self-pay | Admitting: Obstetrics and Gynecology

## 2020-11-03 DIAGNOSIS — O9270 Unspecified disorders of lactation: Secondary | ICD-10-CM

## 2020-11-03 MED ORDER — METOCLOPRAMIDE HCL 10 MG PO TABS: 10.0000 mg | ORAL_TABLET | Freq: Three times a day (TID) | ORAL | 0 refills | Status: DC

## 2020-11-03 NOTE — Progress Notes (Signed)
Postpartum Visit  Chief Complaint:  Chief Complaint  Patient presents with  . Post-op Follow-up    6 wk postpartum - no concerns. RM 6    History of Present Illness: Patient is a 20 y.o. G1P1001 presents for postpartum visit.  Date of delivery: 09/20/2020 Cesarean Section: Fetal intolerance to labor Pregnancy or labor problems:  no Any problems since the delivery:  no  Newborn Details:  SINGLETON :  1. BabyGender female.  Maternal Details:  Breast or formula feeding: plans to breastfeed Intercourse: No  Contraception after delivery: Yes  Any bowel or bladder issues: No  Post partum depression/anxiety noted:  no Edinburgh Post-Partum Depression Score:9 Date of last PAP: N/A under 21  Review of Systems: Review of Systems  Constitutional: Negative.   Gastrointestinal: Negative.   Genitourinary: Negative.   Skin: Negative.   Psychiatric/Behavioral: Negative.     The following portions of the patient's history were reviewed and updated as appropriate: allergies, current medications, past family history, past medical history, past social history, past surgical history and problem list.  Past Medical History:  Past Medical History:  Diagnosis Date  . Anxiety with depression   . Dehydration 05/09/2015  . 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 Hyponatremia 05/09/2015  . Lyme disease   . Migraine   . Orthodontics    has bracees  . Petechiae 05/09/2015  . Pneumonia 05/08/2015  . Pyelonephritis   . Reflux    as infant  . Thrombocytopenia ([place])     Past Surgical History:  Past Surgical History:  Procedure Laterality Date  . CESAREAN SECTION  09/20/2020   Procedure: CESAREAN SECTION;  Surgeon: Malachy Mood, MD;  Location: [facility] ORS;  Service: Obstetrics;;  . Valarie Merino N/A 07/23/2016   Procedure: SEPTOPLASTY;  Surgeon: Clyde Canterbury, MD;  Location: [place];  Service: ENT;  Laterality: N/A;    Family History:  Family History   Problem Relation Age of Onset  . Juvenile Rhematoid Arthritis Sister   . Lupus Mother   . Fibromyalgia Mother   . Hypertension Mother   . Breast cancer Maternal Grandmother   . Diabetes Sister   . Heart disease Maternal Aunt   . Cancer Maternal Aunt     Social History:  Social History   Socioeconomic History  . Marital status: Significant Other    Spouse name: Christy Sartorius  . Number of children: Not on file  . Years of education: Not on file  . Highest education level: Not on file  Occupational History  . Not on file  Tobacco Use  . Smoking status: Never Smoker  . Smokeless tobacco: Never Used  Vaping Use  . Vaping Use: Never used  Substance and Sexual Activity  . Alcohol use: No  . Drug use: Not Currently    Types: Marijuana  . Sexual activity: Yes    Partners: Male    Birth control/protection: None  Other Topics Concern  . Not on file  Social History Narrative  . Not on file   Social Determinants of Health   Financial Resource Strain: Not on file  Food Insecurity: Not on file  Transportation Needs: Not on file  Physical Activity: Not on file  Stress: Not on file  Social Connections: Not on file  Intimate Partner Violence: Not on file    Allergies:  No Known Allergies  Medications: Prior to Admission medications   Medication Sig Start Date End Date Taking? Authorizing Provider  docusate sodium (COLACE) 100 MG capsule  Take 1 capsule (100 mg total) by mouth 2 (two) times daily. Patient not taking: Reported on 09/15/2020 08/27/20   Gae Dry, MD  ibuprofen (ADVIL) 800 MG tablet Take 1 tablet (800 mg total) by mouth every 8 (eight) hours. 09/22/20   Imagene Riches, CNM  norethindrone (MICRONOR) 0.35 MG tablet Take 1 tablet (0.35 mg total) by mouth daily. 09/29/20   Malachy Mood, MD  ondansetron (ZOFRAN ODT) 4 MG disintegrating tablet Take 1 tablet (4 mg total) by mouth every 6 (six) hours as needed for nausea. 02/16/20   Schuman, Stefanie Libel, MD   Prenatal Vit-Fe Fumarate-FA (MULTIVITAMIN-PRENATAL) 27-0.8 MG TABS tablet Take 1 tablet by mouth daily at 12 noon.    [provider]    Physical Exam Blood pressure 120/72, height 5\' 5"  (1.651 m), weight 164 lb (74.4 kg), unknown if currently breastfeeding.  General: NAD HEENT: normocephalic, anicteric Pulmonary: No increased work of breathing Abdomen: soft, non-tender, non-distended.  Umbilicus without lesions.  No hepatomegaly, splenomegaly or masses palpable. No evidence of hernia. Incision D/C/I Genitourinary:  External: Normal external female genitalia.  Normal urethral meatus, normal Bartholin's and Skene's glands.    Vagina: Normal vaginal mucosa, no evidence of prolapse.    Cervix: Grossly normal in appearance, no bleeding  Uterus: Non-enlarged, mobile, normal contour.  No CMT  Adnexa: ovaries non-enlarged, no adnexal masses  Rectal: deferred Extremities: no edema, erythema, or tenderness Neurologic: Grossly intact Psychiatric: mood appropriate, affect full   Edinburgh Postnatal Depression Scale - 11/03/20 1405      Edinburgh Postnatal Depression Scale:  In the Past 7 Days   I have been able to laugh and see the funny side of things. 0    I have looked forward with enjoyment to things. 1    I have blamed myself unnecessarily when things went wrong. 0    I have been anxious or worried for no good reason. 3    I have felt scared or panicky for no good reason. 0    Things have been getting on top of me. 2    I have been so unhappy that I have had difficulty sleeping. 0    I have felt sad or miserable. 2    I have been so unhappy that I have been crying. 1    The thought of harming myself has occurred to me. 0    Edinburgh Postnatal Depression Scale Total 9           Assessment: 20 y.o. G1P1001 presenting for 6 week postpartum visit  Plan: Problem List Items Addressed This Visit   None   Visit Diagnoses    6 weeks postpartum follow-up    -  Primary    Lactation problem          1) Contraception - Education given regarding options for contraception, as well as compatibility with breast feeding if applicable.  Patient plans on oral progesterone-only contraceptive for contraception.  2)  Pap - ASCCP guidelines and rational discussed.  ASCCP guidelines and rational discussed.  Start pap smear at age 72.  72) Patient underwent screening for postpartum depression with no signs of depression  4) Decreased mild supply - Rx for reglan  5)  Return in about 1 year (around 11/03/2021) for annual.   Malachy Mood, MD, [place], [facility] 11/03/2020, 2:50 PM

## 2020-11-21 ENCOUNTER — Other Ambulatory Visit: Payer: Self-pay | Admitting: Obstetrics and Gynecology

## 2020-11-21 MED ORDER — NORGESTIMATE-ETH ESTRADIOL 0.25-35 MG-MCG PO TABS: 1.0000 | ORAL_TABLET | Freq: Every day | ORAL | 3 refills | Status: DC

## 2020-12-18 ENCOUNTER — Telehealth: Payer: Self-pay

## 2020-12-18 NOTE — Telephone Encounter (Signed)
Received a message "The number you are trying to call is not reachable"

## 2020-12-18 NOTE — Telephone Encounter (Signed)
Received message "The person you called has a voice mail box that has not been set up yet"

## 2020-12-18 NOTE — Telephone Encounter (Signed)
Received message "This call can not be completed because there are restrictions on this line"

## 2020-12-18 NOTE — Telephone Encounter (Signed)
Patient spoke w/after hours on call service to report her C-Section scar from 09/20/20 has a long lump that is raised, red , there is blood and pus coming out. Denies fever. After hours advised patient to be seen within 4 hours. Told to go to Urgent Care on [address].

## 2020-12-18 NOTE — Telephone Encounter (Signed)
Sending my chart message. No notes found of patient being seen.

## 2021-06-14 ENCOUNTER — Other Ambulatory Visit: Payer: Self-pay

## 2021-06-14 ENCOUNTER — Ambulatory Visit (INDEPENDENT_AMBULATORY_CARE_PROVIDER_SITE_OTHER): Payer: Medicaid Other | Admitting: Dermatology

## 2021-06-14 DIAGNOSIS — D485 Neoplasm of uncertain behavior of skin: Secondary | ICD-10-CM | POA: Diagnosis not present

## 2021-06-14 NOTE — Progress Notes (Signed)
   New Patient Visit  Subjective  Vanessa Vincent is a 20 y.o. female who presents for the following: bump (R axilla x 1 year - changes in size from larger to smaller and stings especially when she wears deodorant. ).  The following portions of the chart were reviewed this encounter and updated as appropriate:   Tobacco  Allergies  Meds  Problems  Med Hx  Surg Hx  Fam Hx     Review of Systems:  No other skin or systemic complaints except as noted in HPI or Assessment and Plan.  Objective  Well appearing patient in no apparent distress; mood and affect are within normal limits.  A focused examination was performed including the R axilla. Relevant physical exam findings are noted in the Assessment and Plan.  Right Axilla 0.6 cm erythematous skin colored papule.   Assessment & Plan  Neoplasm of uncertain behavior of skin Right Axilla  Epidermal / dermal shaving  Lesion diameter (cm):  0.6 Informed consent: discussed and consent obtained   Timeout: patient name, date of birth, surgical site, and procedure verified   Procedure prep:  Patient was prepped and draped in usual sterile fashion Prep type:  Isopropyl alcohol Anesthesia: the lesion was anesthetized in a standard fashion   Anesthetic:  1% lidocaine w/ epinephrine 1-100,000 buffered w/ 8.4% NaHCO3 Instrument used: flexible razor blade   Hemostasis achieved with: pressure, aluminum chloride and electrodesiccation   Outcome: patient tolerated procedure well   Post-procedure details: sterile dressing applied and wound care instructions given   Dressing type: bandage and petrolatum    Specimen 1 - Surgical pathology Differential Diagnosis: D48.5 irritated nevus r/o dysplasia vs other Check Margins: No  Return if symptoms worsen or fail to improve.  Luther Redo, CMA, am acting as scribe for Forest Gleason, MD .  Documentation: I have reviewed the above documentation for accuracy and completeness, and I agree  with the above.  Forest Gleason, MD

## 2021-06-14 NOTE — Patient Instructions (Signed)

## 2021-06-17 ENCOUNTER — Encounter: Payer: Self-pay | Admitting: Dermatology

## 2021-07-12 ENCOUNTER — Ambulatory Visit: Payer: Medicaid Other

## 2021-07-13 ENCOUNTER — Ambulatory Visit: Payer: Medicaid Other

## 2021-07-18 ENCOUNTER — Ambulatory Visit: Payer: Medicaid Other | Admitting: Advanced Practice Midwife

## 2021-07-24 ENCOUNTER — Encounter: Payer: Self-pay | Admitting: Advanced Practice Midwife

## 2021-07-24 ENCOUNTER — Other Ambulatory Visit (HOSPITAL_COMMUNITY)
Admission: RE | Admit: 2021-07-24 | Discharge: 2021-07-24 | Disposition: A | Discharge status: Home / Self Care | Payer: Medicaid Other | Source: Ambulatory Visit | Attending: Advanced Practice Midwife | Admitting: Advanced Practice Midwife

## 2021-07-24 ENCOUNTER — Ambulatory Visit (INDEPENDENT_AMBULATORY_CARE_PROVIDER_SITE_OTHER): Payer: Medicaid Other | Admitting: Advanced Practice Midwife

## 2021-07-24 ENCOUNTER — Other Ambulatory Visit: Payer: Self-pay

## 2021-07-24 VITALS — BP 106/62 | Ht 65.0 in | Wt 177.0 lb

## 2021-07-24 DIAGNOSIS — N941 Unspecified dyspareunia: Secondary | ICD-10-CM | POA: Diagnosis not present

## 2021-07-24 DIAGNOSIS — N926 Irregular menstruation, unspecified: Secondary | ICD-10-CM

## 2021-07-24 DIAGNOSIS — Z842 Family history of other diseases of the genitourinary system: Secondary | ICD-10-CM

## 2021-07-24 DIAGNOSIS — Z124 Encounter for screening for malignant neoplasm of cervix: Secondary | ICD-10-CM | POA: Insufficient documentation

## 2021-07-24 LAB — POCT URINE PREGNANCY: Preg Test, Ur: NEGATIVE

## 2021-07-24 MED ORDER — ORILISSA 150 MG PO TABS: 150.0000 mg | ORAL_TABLET | Freq: Every day | ORAL | 2 refills | Status: DC

## 2021-07-25 ENCOUNTER — Encounter: Payer: Self-pay | Admitting: Advanced Practice Midwife

## 2021-07-25 NOTE — Progress Notes (Signed)
Patient ID: Vanessa Vincent, female   DOB: 03-05-2001, 20 y.o.   MRN: [id number]  Reason for Consult: Dyspareunia (Painful during intercourse, amenorrhea )   Subjective:  Date of Service: 07/24/2021  HPI:  Vanessa Vincent is a 20 y.o. female being seen for pain with intercourse since her recent delivery in December of 2021. The pain is in her pelvis and is sharp. She denies pain at other times. She denies vaginal dryness or pain only with deeper penetration. Her sister has had surgery for endometriosis and she is wondering if she possibly has the condition. She is not interested in taking hormones for treatment. She is interested in trying [place] and will follow up with MD as desired for further treatment.   She has not had a period since one initial period in the first 6 weeks postpartum. She breastfed for approximately 1 or 2 months. She initially started on POP birth control at 6 week visit and then switched to [facility] when she stopped breastfeeding. She only took the birth control for 1 or 2 months. She has a history of irregular/absent menses.    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   Hyponatremia 05/09/2015   Lyme disease    Migraine    Orthodontics    has bracees   Petechiae 05/09/2015   Pneumonia 05/08/2015   Pyelonephritis    Reflux    as infant   Thrombocytopenia ([place])    Family History  Problem Relation Age of Onset   Juvenile Rhematoid Arthritis Sister    Lupus Mother    Fibromyalgia Mother    Hypertension Mother    Breast cancer Maternal Grandmother    Diabetes Sister    Heart disease Maternal Aunt    Cancer Maternal Aunt    Past Surgical History:  Procedure Laterality Date   CESAREAN SECTION  09/20/2020   Procedure: CESAREAN SECTION;  Surgeon: Malachy Mood, MD;  Location: [facility] ORS;  Service: Obstetrics;;   SEPTOPLASTY N/A 07/23/2016   Procedure:  SEPTOPLASTY;  Surgeon: Clyde Canterbury, MD;  Location: [facility];  Service: ENT;  Laterality: N/A;    Short Social History:  Social History   Tobacco Use   Smoking status: Never   Smokeless tobacco: Never  Substance Use Topics   Alcohol use: No    No Known Allergies  Current Outpatient Medications  Medication Sig Dispense Refill   Elagolix Sodium (ORILISSA) 150 MG TABS Take 150 mg by mouth daily. 30 tablet 2   No current facility-administered medications for this visit.    Review of Systems  Constitutional:  Negative for chills and fever.  HENT:  Negative for congestion, ear discharge, ear pain, hearing loss, sinus pain and sore throat.   Eyes:  Negative for blurred vision and double vision.  Respiratory:  Negative for cough, shortness of breath and wheezing.   Cardiovascular:  Negative for chest pain, palpitations and leg swelling.  Gastrointestinal:  Negative for abdominal pain, blood in stool, constipation, diarrhea, heartburn, melena, nausea and vomiting.  Genitourinary:  Negative for dysuria, flank pain, frequency, hematuria and urgency.       Positive for dyspareunia  Musculoskeletal:  Negative for back pain, joint pain and myalgias.  Skin:  Negative for itching and rash.  Neurological:  Negative for dizziness, tingling, tremors, sensory change, speech change, focal weakness, seizures, loss of consciousness, weakness and headaches.  Endo/Heme/Allergies:  Negative for  environmental allergies. Does not bruise/bleed easily.  Psychiatric/Behavioral:  Negative for depression, hallucinations, memory loss, substance abuse and suicidal ideas. The patient is not nervous/anxious and does not have insomnia.        Objective:  Objective   Vitals:   07/24/21 1408  BP: 106/62  Weight: 177 lb (80.3 kg)  Height: 5\' 5"  (1.651 m)   Body mass index is 29.45 kg/m. Constitutional: Well nourished, well developed female in no acute distress.  HEENT: normal Skin: Warm and  dry.  Cardiovascular: Regular rate and rhythm.   Extremity:  no edema   Respiratory: Clear to auscultation bilateral. Normal respiratory effort Neuro: DTRs 2+, Cranial nerves grossly intact Psych: Alert and Oriented x3. No memory deficits. Normal mood and affect.    Pelvic exam:  is not limited by body habitus EGBUS: within normal limits Vagina: within normal limits and with normal mucosa, no pain response to speculum Cervix: normal appearance   Data: UPT: negative     Assessment/Plan:     20 y.o. G1 P102 female with dyspareunia  Rx Freida Busman trial (may not be covered by insurance) Follow up as needed with MD   [facility] 07/25/2021, 2:34 PM

## 2021-07-25 NOTE — Patient Instructions (Signed)
Endometriosis  Endometriosis is a condition in which a tissue similar to the endometrium grows in places outside the uterus. The endometrium is a tissue that forms the lining of the uterus. This tissue can grow in the organs that create the eggs (ovaries), the tubes that carry the eggs to the uterus (fallopian tubes), the vagina, and the bowel. This tissue most often grows on the ovaries and inner lining of the pelvic cavity (peritoneum). When the uterus sheds the endometrium every menstrual cycle, there is bleeding wherever these types of tissue are located. This can cause pain because blood is irritating to tissues that are not normally exposed to it. Endometriosis canalso make it harder for a woman to get pregnant. What are the causes? The cause of this condition is not known. What increases the risk? The following factors may make you more likely to develop this condition: Having a family history of endometriosis. Having never given birth. Starting your period at 10 years of age or younger. What are the signs or symptoms? Often, there are no symptoms of this condition. If you do have symptoms, they may: Vary depending on where the abnormal tissue is growing. Occur during your menstrual period (most often) or at the middle of your cycle. Come and go. You may have no symptoms during some months. Stop when you no longer have your monthly periods (menopause). Symptoms may include: Pain in the area between your hip bones (pelvis). Heavier bleeding during periods. Menstrual periods that happen more than once a month. Pain during sex. Pain in the back or abdomen. Painful bowel movements. Not being able to get pregnant. How is this diagnosed? This condition is diagnosed based on your symptoms and a physical exam. You may have tests, such as: Blood tests and urine tests to help rule out other causes. Ultrasound to look for tissues that are not normal. This is often done over your skin. It is  sometimes done through the vagina (transvaginal). X-ray of the lower bowel (barium enema). CT scan. MRI. To confirm the diagnosis, your health care provider may use a device with a small camera to check tissue inside your abdomen (laparoscopy). Abnormal tissue may be removed and checked in a lab (biopsy). How is this treated? There is no cure for this condition. Treatment focuses on controlling your symptoms. The type of treatment also depends on whether you want to become pregnant in the future. This condition may be treated with: Medicines. These may include: Medicines to relieve pain, including NSAIDs such as ibuprofen. Hormone therapy. This uses artificial hormones to slow the growth of the abnormal tissue. This may include hormonal birth control, such as pills. Surgery to remove the abnormal tissue. During surgery: Tissue may be removed using a laparoscope and a laser (laparoscopic laser treatment). The fallopian tubes, uterus, and ovaries may be removed (hysterectomy). This is done in very severe cases. Follow these instructions at home: Get regular exercise. Limit alcohol use. Eat a balanced diet. Avoid caffeine. Take over-the-counter and prescription medicines only as told by your health care provider. Keep all follow-up visits as told by your health care provider. This is important. Where to find more information American College of Obstetricians and Gynecologists: https://www.acog.org/ Office on Women's Health: https://www.womenshealth.gov/ Contact a health care provider if: You are having new pain or trouble controlling pain. You have problems getting pregnant. You have a fever. Get help right away if you have: Severe pain that does not get better with medicine. Severe nausea and vomiting, or   if you cannot eat or drink without vomiting. Pain that affects your abdomen only on the lower, right side. Pain in your abdomen that gets worse. Swelling in your abdomen. Blood in  your stool (feces). Summary Endometriosis is a condition in which a tissue similar to the endometrium grows in places outside the uterus. The endometrium is a tissue that forms the lining of the uterus. The cause of this condition is not known. This condition may be treated with medicines to relieve pain, hormone therapy, or surgery. If you have this condition, get regular exercise, limit alcohol use, and avoid caffeine. Get help right away if you have severe pain that does not get better with medicine, or if you have severe nausea and vomiting or blood in your stool. This information is not intended to replace advice given to you by your health care provider. Make sure you discuss any questions you have with your healthcare provider. Document Revised: 11/24/2019 Document Reviewed: 11/24/2019 Elsevier Patient Education  2021 Elsevier Inc.  

## 2021-07-26 LAB — CYTOLOGY - PAP: Diagnosis: NEGATIVE

## 2021-10-11 ENCOUNTER — Ambulatory Visit (INDEPENDENT_AMBULATORY_CARE_PROVIDER_SITE_OTHER): Payer: Medicaid Other | Admitting: Licensed Practical Nurse

## 2021-10-11 ENCOUNTER — Other Ambulatory Visit: Payer: Self-pay

## 2021-10-11 ENCOUNTER — Encounter: Payer: Self-pay | Admitting: Licensed Practical Nurse

## 2021-10-11 VITALS — BP 122/68 | Ht 65.0 in | Wt 181.0 lb

## 2021-10-11 DIAGNOSIS — N898 Other specified noninflammatory disorders of vagina: Secondary | ICD-10-CM

## 2021-10-11 DIAGNOSIS — R3915 Urgency of urination: Secondary | ICD-10-CM | POA: Diagnosis not present

## 2021-10-11 DIAGNOSIS — R300 Dysuria: Secondary | ICD-10-CM

## 2021-10-11 DIAGNOSIS — N926 Irregular menstruation, unspecified: Secondary | ICD-10-CM

## 2021-10-11 LAB — POCT URINE PREGNANCY: Preg Test, Ur: NEGATIVE

## 2021-10-11 LAB — POCT URINALYSIS DIPSTICK
Bilirubin, UA: NEGATIVE
Glucose, UA: NEGATIVE
Ketones, UA: NEGATIVE
Nitrite, UA: NEGATIVE
Protein, UA: NEGATIVE
Spec Grav, UA: 1.02 (ref 1.010–1.025)
Urobilinogen, UA: 0.2 E.U./dL
pH, UA: 5 (ref 5.0–8.0)

## 2021-10-11 NOTE — Progress Notes (Signed)
HPI:      [name] is a 20 y.o. G1P1001 who LMP was Patient's last menstrual period was 08/15/2021., presents today for a problem visit.  She complains of urinary urgency and dysuria x 1 week, vaginal discomfort-it uncomfortable to sit but not exactly painful, vaginal discharge x 1 day that is yellow.  Denies fevers, low back/flank pain, bleeding after sex or intermenstrual bleeding.  Expected a cycle in Nov, but did not come, today neg UPT.  Admits that sometimes she does not have a period for a while.   She is sexually active with 1 partner, although they do not regularly have penis in vagina activity, they last did about a week ago, they are not contracepting.   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 Also,  has a past surgical history that includes Septoplasty (N/A, 07/23/2016) and Cesarean section (09/20/2020)., family history includes Breast cancer in her maternal grandmother; Cancer in her maternal aunt; Diabetes in her sister; Fibromyalgia in her mother; Heart disease in her maternal aunt; Hypertension in her mother; Juvenile Rhematoid Arthritis in her sister; Lupus in her mother.,  reports that she has never smoked. She has never used smokeless tobacco. She reports that she does not currently use drugs after having used the following drugs: Marijuana. She reports that she does not drink alcohol.  She has a current medication list which includes the following prescription(s): orilissa. Also, has No Known Allergies.  Review of Systems  Constitutional:  Negative for chills and fever.  Genitourinary:        Painful intercourse  Neurological:  Positive for headaches.  Psychiatric/Behavioral:  Positive for depression.         Anxiety   -Headaches: gets Migraines, her PCP prescribed something but now she is out. Tylenol does not help.  -Depression/Anxiety: Has seen a therapist-did not seem to help, was prescribed a medication that did not seem to help-She does not remember the name of it.  -Painful Intercourse: since childbirth, did not try Freida Busman as she did not want to take it because she was not sure of the cause of her pain.  aware she  needs to see OB    Objective: BP 122/68    Ht 5\' 5"  (1.651 m)    Wt 181 lb (82.1 kg)    LMP 08/15/2021    BMI 30.12 kg/m  Physical Exam Genitourinary:     Vulva normal.     Genitourinary Comments: Cervix pink, no lesions, physiologic discharge present,  Uterus and adnexa: not enlarged, no masses, non-tender, no CMT.   Abdominal:     General: Abdomen is flat.     Palpations: Abdomen is soft.     Comments: Non-tender  Neurological:     Mental Status: She is alert.  Neg CVAT  PHQ-9 16 GAD-7 9  Wet prep: neg clue, hyphae and trich Urine dipstick shows positive for WBC's and positive for RBC's.    ASSESSMENT/PLAN:    Problem List Items Addressed This Visit   None Visit Diagnoses     Burning with urination    -  Primary   Relevant Orders   POCT urinalysis dipstick (Completed)   Urine Culture   Missed period       Relevant Orders   POCT urine pregnancy (Completed)   Urgency of micturition  Relevant Orders   Urine Culture   Vaginal discharge       Relevant Orders   NuSwab Vaginitis Plus (VG+)       -Will fu with PCP regarding management for depression and Migraines - +++ blood in urine today, will create plan based on urine culture-may need additional follow up  -Needs to see OB regarding painful intercourse

## 2021-10-15 LAB — URINE CULTURE

## 2021-10-16 ENCOUNTER — Telehealth: Payer: Self-pay

## 2021-10-16 NOTE — Telephone Encounter (Signed)
Pt is still having pain with urination and pain while sitting down, pt wants to know does she need a antibiotic since she had a trace of leukocytes or wait on the other results?

## 2021-10-17 ENCOUNTER — Encounter: Payer: Self-pay | Admitting: Licensed Practical Nurse

## 2021-10-17 ENCOUNTER — Other Ambulatory Visit: Payer: Self-pay | Admitting: Licensed Practical Nurse

## 2021-10-17 DIAGNOSIS — R300 Dysuria: Secondary | ICD-10-CM

## 2021-10-17 LAB — NUSWAB VAGINITIS PLUS (VG+)
Candida albicans, NAA: NEGATIVE
Candida glabrata, NAA: NEGATIVE
Chlamydia trachomatis, NAA: NEGATIVE
Neisseria gonorrhoeae, NAA: NEGATIVE
Trich vag by NAA: NEGATIVE

## 2021-10-17 NOTE — Progress Notes (Signed)
Vanessa Vincent seen for dysuria and pelvic discomfort. All labs negative.  Referral to uro-gyn made.  [name], CNM  [facility], [facility]  10/17/21  11:49 AM

## 2021-10-17 NOTE — Telephone Encounter (Signed)
Called pt no answer. Will try again later.

## 2021-11-20 ENCOUNTER — Ambulatory Visit: Payer: Medicaid Other | Admitting: Obstetrics and Gynecology

## 2021-11-28 ENCOUNTER — Ambulatory Visit: Payer: Medicaid Other | Admitting: Obstetrics and Gynecology

## 2021-12-18 ENCOUNTER — Other Ambulatory Visit (HOSPITAL_COMMUNITY)
Admission: RE | Admit: 2021-12-18 | Discharge: 2021-12-18 | Disposition: A | Discharge status: Home / Self Care | Payer: Medicaid Other | Source: Ambulatory Visit | Attending: Obstetrics and Gynecology | Admitting: Obstetrics and Gynecology

## 2021-12-18 ENCOUNTER — Encounter: Payer: Self-pay | Admitting: Obstetrics and Gynecology

## 2021-12-18 ENCOUNTER — Telehealth: Payer: Self-pay

## 2021-12-18 ENCOUNTER — Ambulatory Visit (INDEPENDENT_AMBULATORY_CARE_PROVIDER_SITE_OTHER): Payer: Medicaid Other | Admitting: Obstetrics and Gynecology

## 2021-12-18 ENCOUNTER — Other Ambulatory Visit: Payer: Self-pay

## 2021-12-18 VITALS — BP 114/71 | HR 101 | Ht 65.0 in | Wt 174.0 lb

## 2021-12-18 DIAGNOSIS — M62838 Other muscle spasm: Secondary | ICD-10-CM

## 2021-12-18 DIAGNOSIS — R3989 Other symptoms and signs involving the genitourinary system: Secondary | ICD-10-CM

## 2021-12-18 DIAGNOSIS — R350 Frequency of micturition: Secondary | ICD-10-CM | POA: Diagnosis not present

## 2021-12-18 DIAGNOSIS — K5900 Constipation, unspecified: Secondary | ICD-10-CM

## 2021-12-18 DIAGNOSIS — R82998 Other abnormal findings in urine: Secondary | ICD-10-CM

## 2021-12-18 LAB — POCT URINALYSIS DIPSTICK
Appearance: ABNORMAL
Bilirubin, UA: NEGATIVE
Glucose, UA: NEGATIVE
Ketones, UA: NEGATIVE
Nitrite, UA: POSITIVE
Protein, UA: POSITIVE — AB
Spec Grav, UA: 1.025 (ref 1.010–1.025)
Urobilinogen, UA: 2 E.U./dL — AB
pH, UA: 7.5 (ref 5.0–8.0)

## 2021-12-18 MED ORDER — NITROFURANTOIN MONOHYD MACRO 100 MG PO CAPS: 100.0000 mg | ORAL_CAPSULE | Freq: Two times a day (BID) | ORAL | 0 refills | Status: AC

## 2021-12-18 MED ORDER — CIMETIDINE 200 MG PO TABS: 200.0000 mg | ORAL_TABLET | Freq: Two times a day (BID) | ORAL | 5 refills | Status: DC

## 2021-12-18 NOTE — Patient Instructions (Addendum)
Today we talked about ways to manage bladder urgency such as altering your diet to avoid irritative beverages and foods (bladder diet) as well as attempting to decrease stress and other exacerbating factors.    There is a website with helpful information for people with bladder irritation, called the [place] at https://www.ic-network.com. This website has more information about a healthy bladder diet and patient forums for support.  The Most Bothersome Foods* The Least Bothersome Foods*  Coffee - Regular & Decaf Tea - caffeinated Carbonated beverages - cola, non-colas, diet & caffeine-free Alcohols - Beer, Red Wine, White Wine, Champagne Fruits - Grapefruit, Klein, Orange, [employer] - Cranberry, Grapefruit, Orange, Pineapple Vegetables - Tomato & Tomato Products Flavor Enhancers - Hot peppers, Spicy foods, Chili, Horseradish, Vinegar, Monosodium glutamate (MSG) Artificial Sweeteners - NutraSweet, Sweet 'N Low, Equal (sweetener), Saccharin Ethnic foods - [place], [place], [place] food Express Scripts - low-fat & whole Fruits - Bananas, Blueberries, Honeydew melon, Pears, Raisins, Watermelon Vegetables - Broccoli, Brussels Sprouts, [place], Carrots, Cauliflower, [place], Cucumber, Mushrooms, Peas, Radishes, Squash, Zucchini, White potatoes, Sweet potatoes & yams Poultry - Chicken, Eggs, [place], [employer] - Beef, Programmer, multimedia, Lamb Seafood - Shrimp, [place] fish, Salmon Grains - Oat, Rice Snacks - Pretzels, Popcorn  *Lissa Morales et al. Diet and its role in interstitial cystitis/bladder pain syndrome (IC/BPS) and comorbid conditions. Conrad 2012 Jan 11.    Additional treatments to try for relief of IC symptoms:  Over the counter natural supplements: -Bladder Ease by [employer]itanica, Bladder Rest or Bladder Builder (all contain L-arginine) - helps to rebuild protective bladder layer and reduce bladder pain -Marshmallow Root (relieves inflammation in the urinary tract) - pills  available or drink as a tea -Aloe Vera: [place] Aloe Vera capsules,  AloePath (also contains L-arginine and calcium cabonate)- can take several months to see improvement -Fish oil - 4 g daily -Tumeric (standardized to 95% curcuminoids) - 500 mg three times daily  -Mindfulness practice (yoga, meditation, deep breathing)   -If constipation as well, Magnesium supplement daily (reduces bladder spasms, helps constipation)) -If IBS as well, try low FODMAP or SIBO diet  - https://www.siboinfo.com/diet.html

## 2021-12-18 NOTE — Telephone Encounter (Signed)
Scheduled for 03/11/2022

## 2021-12-18 NOTE — Progress Notes (Signed)
[facility] New Patient Evaluation and Consultation  Referring Provider: Dominic, Nunzio Cobbs, Loletha Grayer* PCP: Frazier Richards, MD Date of Service: 12/18/2021  SUBJECTIVE Chief Complaint: New Patient (Initial Visit) (Vanessa Vincent is a 21 y.o. female here for a consult on UTI sx without UTI's)  History of Present Illness: Vanessa Vincent is a 21 y.o. White or Caucasian female seen in consultation at the request of CNM [name] for evaluation of burning with urination.    Review of records significant for: Has had episodes of burning and urgency with urination but negative cultures.   Urinary Symptoms: Leaks urine with with urgency Leaks 2 time(s) per day.  Pad use: none She is not bothered by her UI symptoms.  Day time voids 4-5.  Nocturia: 3 times per night to void. Voiding dysfunction: she empties her bladder well.  does not use a catheter to empty bladder.  When urinating, she feels she has no difficulties and to push on her belly or vagina to empty bladder Drinks: water, occasionally drinks sweet tea or soda but rare.   UTIs:  0  UTI's in the last year.   Denies history of blood in urine and kidney or bladder stones Pain with urination has been happening on and off for a few years.  Does not notice any foods/ drinks that make it worse other than soda.  Has pain after intercourse.  Drinking a lot of water helps.  Today is taking Azo because is having some uncomfortable feeling and burning. Usually it helps but has not today.   Pelvic Organ Prolapse Symptoms:                  She Denies a feeling of a bulge the vaginal area.   Bowel Symptom: Bowel movements: 1 time(s) per week Stool consistency: hard Straining: yes.  Splinting: yes.  Incomplete evacuation: yes.  She Denies accidental bowel leakage / fecal incontinence Bowel regimen: none- has tried miralax, stool softeners   Sexual Function Sexually active: yes.  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   Hyponatremia 05/09/2015   Lyme disease    Migraine    Orthodontics    has bracees   Petechiae 05/09/2015   Pneumonia 05/08/2015   Pyelonephritis    Reflux    as infant   Thrombocytopenia ([facility])      Past Surgical History:   Past Surgical History:  Procedure Laterality Date   CESAREAN SECTION  09/20/2020   Procedure: CESAREAN SECTION;  Surgeon: Malachy Mood, MD;  Location: [facility];  Service: Obstetrics;;   SEPTOPLASTY N/A 07/23/2016   Procedure: SEPTOPLASTY;  Surgeon: Clyde Canterbury, MD;  Location: [place];  Service: ENT;  Laterality: N/A;     Past OB/GYN History: OB History  Gravida Para Term Preterm AB Living  1 1 1  0 0 1  SAB IAB Ectopic Multiple Live Births  0 0 0 0 1    # Outcome Date GA Lbr Len/2nd Weight Sex Delivery Anes PTL Lv  1 Term 09/20/20 [redacted]w[redacted]d  6 lb 9.1 oz (2.98 kg) F CS-LTranv EPI  LIV   Contraception: none.   Medications: She has a current medication list which  includes the following prescription(s): cimetidine and nitrofurantoin (macrocrystal-monohydrate).   Allergies: Patient has No Known Allergies.   Social History:  Social History   Tobacco Use   Smoking status: Never   Smokeless tobacco: Never  Vaping Use   Vaping Use: Never used  Substance Use Topics   Alcohol use: No   Drug use: Not Currently    Types: Marijuana    Relationship status: long-term partner She is not employed. Regular exercise: Yes: walks a few miles weekly History of abuse: No  Family History:   Family History  Problem Relation Age of Onset   Juvenile Rhematoid Arthritis Sister    Lupus Mother    Fibromyalgia Mother    Hypertension Mother     Breast cancer Maternal Grandmother    Diabetes Sister    Heart disease Maternal Aunt    Cancer Maternal Aunt      Review of Systems: Review of Systems  Constitutional:  Negative for fever, malaise/fatigue and weight loss.  Respiratory:  Negative for cough, shortness of breath and wheezing.   Cardiovascular:  Negative for chest pain, palpitations and leg swelling.  Gastrointestinal:  Positive for abdominal pain. Negative for blood in stool.  Genitourinary:  Positive for dysuria.  Musculoskeletal:  Negative for myalgias.  Skin:  Negative for rash.  Neurological:  Positive for headaches. Negative for dizziness.  Endo/Heme/Allergies:  Does not bruise/bleed easily.  Psychiatric/Behavioral:  Negative for depression. The patient is not nervous/anxious.     OBJECTIVE Physical Exam: Vitals:   12/18/21 1046  BP: 114/71  Pulse: (!) 101  Weight: 174 lb (78.9 kg)  Height: 5\' 5"  (1.651 m)    Physical Exam Constitutional:      General: She is not in acute distress. Pulmonary:     Effort: Pulmonary effort is normal.  Abdominal:     General: There is no distension.     Palpations: Abdomen is soft.     Tenderness: There is no abdominal tenderness. There is no rebound.  Musculoskeletal:        General: No swelling. Normal range of motion.  Skin:    General: Skin is warm and dry.     Findings: No rash.  Neurological:     Mental Status: She is alert and oriented to person, place, and time.  Psychiatric:        Mood and Affect: Mood normal.        Behavior: Behavior normal.     GU / Detailed Urogynecologic Evaluation:  Pelvic Exam: Normal external female genitalia; Bartholin's and Skene's glands normal in appearance; urethral meatus normal in appearance, no urethral masses or discharge.   CST: negative  Speculum exam reveals normal vaginal mucosa without atrophy. Cervix normal appearance. Uterus normal single, nontender. Adnexa no mass, fullness, tenderness.    Pelvic floor  strength III/V  Pelvic floor musculature: + high tone bilaterally, Right levator non-tender, Right obturator non-tender, Left levator tender, Left obturator tender  POP-Q:   POP-Q  -2.5                                            Aa   -2.5  Ba  -7                                              C   3.5                                            Gh  3                                            Pb  8                                            tvl   -3                                            Ap  -3                                            Bp  -7.5                                              D     Rectal Exam:  Normal external rectum.   Post-Void Residual (PVR) by Bladder Scan: In order to evaluate bladder emptying, we discussed obtaining a postvoid residual and she agreed to this procedure.  Procedure: The ultrasound unit was placed on the patient's abdomen in the suprapubic region after the patient had voided. A PVR of 5 ml was obtained by bladder scan.  Laboratory Results: POC urine: moderate blood, positive nitrites, positive protein, small blood. Pt had taken [facility] Ms. Rane is a 21 y.o. with:  1. Bladder pain   2. Levator spasm   3. Urinary frequency   4. Constipation, unspecified constipation type   5. Leukocytes in urine    Bladder pain - For irritative bladder we reviewed treatment options including altering her diet to avoid irritative beverages and foods as well as attempting to decrease stress and other exacerbating factors.  We also discussed using pyridium and similar over-the-counter medications for pain relief as needed. We discussed the pentad of medications including Elmiron, Tums, an antihistamine, amitriptyline, and L-arginine.  We also discussed in-office bladder instillations for pain flares. She was also given information on the [place] at https://www.ic-network.com for bladder  diet suggestions and patient forums for support. - List provided of bladder irritants. Encouraged to keep food diary if she has flares - Cimetidine 200mg  BID ordered - Also encouraged bladder supplement with L-arginine  2. Levator spasm - likely contributing to bladder pain, dyspareunia and constipation - Referral placed for pelvic PT- [facility] re[facility]  3. Urinary frequency/ urge incontinence -  likely exacerbated by bladder pain symptoms and levator spasm - We reviewed that PT can help with these symptoms  4. Constipation - has tried several OTC medications without success.  - We reviewed that constipation can contribute to bladder urgency as well - Will refer to GI for further treatment recommendations  5. Leukocytes in urine - will treat for UTI today with macrobid 100mg  BID - urine sent for culture  Return 4 months or sooner if needed  Jaquita Folds, MD   Medical Decision Making:  - Reviewed/ ordered a clinical laboratory test - Review and summation of prior records

## 2021-12-20 LAB — URINE CULTURE: Culture: >=100000 — AB

## 2022-03-11 ENCOUNTER — Ambulatory Visit (INDEPENDENT_AMBULATORY_CARE_PROVIDER_SITE_OTHER): Payer: Medicaid Other | Admitting: Gastroenterology

## 2022-03-11 ENCOUNTER — Encounter: Payer: Self-pay | Admitting: Gastroenterology

## 2022-03-11 ENCOUNTER — Other Ambulatory Visit: Payer: Self-pay

## 2022-03-11 VITALS — BP 130/84 | HR 72 | Temp 97.9°F | Ht 65.0 in | Wt 183.0 lb

## 2022-03-11 DIAGNOSIS — K5900 Constipation, unspecified: Secondary | ICD-10-CM

## 2022-03-11 DIAGNOSIS — K921 Melena: Secondary | ICD-10-CM

## 2022-03-11 MED ORDER — NA SULFATE-K SULFATE-MG SULF 17.5-3.13-1.6 GM/177ML PO SOLN: 354.0000 mL | Freq: Once | ORAL | 0 refills | Status: AC

## 2022-03-11 NOTE — Progress Notes (Signed)
Jonathon Bellows MD, MRCP(U.K) [address]  [place]  [place], [place] [address]  Main: [phone]  Fax: [phone]   Gastroenterology Consultation  Referring Provider:     Jaquita Folds, * Primary Care Physician:  Frazier Richards, MD Primary Gastroenterologist:  Dr. Jonathon Bellows  Reason for Consultation:    Constipation  HPI:   Vanessa Vincent is a 21 y.o. y/o female referred for consultation & management  by Dr. Frazier Richards, MD.      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   Hyponatremia 05/09/2015   Lyme disease    Migraine    Orthodontics    has bracees   Petechiae 05/09/2015   Pneumonia 05/08/2015   Pyelonephritis    Reflux    as infant   Thrombocytopenia ([place])     Past Surgical History:  Procedure Laterality Date   CESAREAN SECTION  09/20/2020   Procedure: CESAREAN SECTION;  Surgeon: Malachy Mood, MD;  Location: [facility];  Service: Obstetrics;;   SEPTOPLASTY N/A 07/23/2016   Procedure: SEPTOPLASTY;  Surgeon: Clyde Canterbury, MD;  Location: [facility];  Service: ENT;  Laterality: N/A;    Prior to Admission medications   Medication Sig Start Date End Date Taking? Authorizing Provider  cimetidine (CIMETIDINE 200) 200 MG tablet Take 1 tablet (200 mg total) by mouth 2 (two) times daily. 12/18/21   Jaquita Folds, MD    Family History   Problem Relation Age of Onset   Juvenile Rhematoid Arthritis Sister    Lupus Mother    Fibromyalgia Mother    Hypertension Mother    Breast cancer Maternal Grandmother    Diabetes Sister    Heart disease Maternal Aunt    Cancer Maternal Aunt      Social History   Tobacco Use   Smoking status: Never   Smokeless tobacco: Never  Vaping Use   Vaping Use: Never used  Substance Use Topics   Alcohol use: No   Drug use: Not Currently    Types: Marijuana    Allergies as of 03/11/2022   (No Known Allergies)    Review of Systems:    All systems reviewed and negative except where noted in HPI.   Physical Exam:  There were no vitals taken for this visit. No LMP recorded. Psych:  Alert and cooperative. Normal mood and affect. General:   Alert,  Well-developed, well-nourished, pleasant and cooperative in NAD Head:  Normocephalic and atraumatic. Eyes:  Sclera clear, no icterus.   Conjunctiva pink. Neurologic:  Alert and oriented x3;  grossly normal neurologically. Psych:  Alert and cooperative. Normal mood and affect.  Imaging Studies: No results found.  Assessment and Plan:   ATHALEE ESTERLINE is a 21 y.o. y/o female has been referred for constipation.  She can go up to a week without a bowel movement.  Has noticed some blood on her stools in the past.  Has failed MiraLAX.  Plan 1.  CBC, TSH 2.  High-fiber diet discussed about increasing dietary fiber to a target of 25 to 30 g/day.  Patient information provided. 3.  Trial of Linzess 290 mcg.  Sample of 1 week Provided If It Works sHe Should Call My Office for Prescription 4.  Colonoscopy   I have discussed alternative options, risks & benefits,  which include, but are not limited to, bleeding, infection, perforation,respiratory complication & drug reaction.  The patient agrees with this plan & written consent will be obtained.    Follow up in as needed  Dr Jonathon Bellows MD,MRCP(U.K)

## 2022-03-11 NOTE — Patient Instructions (Signed)

## 2022-03-12 ENCOUNTER — Encounter: Payer: Self-pay | Admitting: Gastroenterology

## 2022-03-12 LAB — CBC WITH DIFFERENTIAL/PLATELET
Basophils Absolute: 0 10*3/uL (ref 0.0–0.2)
Basos: 0 %
EOS (ABSOLUTE): 0.1 10*3/uL (ref 0.0–0.4)
Eos: 3 %
Hematocrit: 40.5 % (ref 34.0–46.6)
Hemoglobin: 13.4 g/dL (ref 11.1–15.9)
Immature Grans (Abs): 0 10*3/uL (ref 0.0–0.1)
Immature Granulocytes: 0 %
Lymphocytes Absolute: 1.5 10*3/uL (ref 0.7–3.1)
Lymphs: 30 %
MCH: 29.8 pg (ref 26.6–33.0)
MCHC: 33.1 g/dL (ref 31.5–35.7)
MCV: 90 fL (ref 79–97)
Monocytes Absolute: 0.5 10*3/uL (ref 0.1–0.9)
Monocytes: 10 %
Neutrophils Absolute: 2.9 10*3/uL (ref 1.4–7.0)
Neutrophils: 57 %
Platelets: 175 10*3/uL (ref 150–450)
RBC: 4.5 x10E6/uL (ref 3.77–5.28)
RDW: 12.7 % (ref 11.7–15.4)
WBC: 5.2 10*3/uL (ref 3.4–10.8)

## 2022-03-12 LAB — TSH: TSH: 2.05 u[IU]/mL (ref 0.450–4.500)

## 2022-03-12 NOTE — Progress Notes (Signed)
a 

## 2022-03-15 ENCOUNTER — Encounter: Payer: Self-pay | Admitting: Gastroenterology

## 2022-03-19 ENCOUNTER — Ambulatory Visit
Admission: RE | Admit: 2022-03-19 | Discharge: 2022-03-19 | Disposition: A | Discharge status: Home / Self Care | Payer: Medicaid Other | Attending: Gastroenterology | Admitting: Gastroenterology

## 2022-03-19 ENCOUNTER — Ambulatory Visit: Payer: Medicaid Other | Admitting: Registered Nurse

## 2022-03-19 ENCOUNTER — Encounter: Payer: Self-pay | Admitting: Gastroenterology

## 2022-03-19 ENCOUNTER — Other Ambulatory Visit: Payer: Self-pay

## 2022-03-19 ENCOUNTER — Encounter
Admission: RE | Disposition: A | Discharge status: Home / Self Care | Payer: Self-pay | Source: Home / Self Care | Attending: Gastroenterology

## 2022-03-19 DIAGNOSIS — K921 Melena: Secondary | ICD-10-CM

## 2022-03-19 DIAGNOSIS — K640 First degree hemorrhoids: Secondary | ICD-10-CM | POA: Insufficient documentation

## 2022-03-19 DIAGNOSIS — K5900 Constipation, unspecified: Secondary | ICD-10-CM | POA: Insufficient documentation

## 2022-03-19 HISTORY — PX: COLONOSCOPY WITH PROPOFOL: SHX5780

## 2022-03-19 LAB — POCT PREGNANCY, URINE: Preg Test, Ur: NEGATIVE

## 2022-03-19 SURGERY — COLONOSCOPY WITH PROPOFOL
Anesthesia: General

## 2022-03-19 MED ORDER — STERILE WATER FOR IRRIGATION IR SOLN
Status: DC | PRN
  Administered 2022-03-19: 100 mL

## 2022-03-19 MED ORDER — EPHEDRINE SULFATE (PRESSORS) 50 MG/ML IJ SOLN
INTRAMUSCULAR | Status: DC | PRN
  Administered 2022-03-19: 5 mg via INTRAVENOUS

## 2022-03-19 MED ORDER — DEXMEDETOMIDINE HCL 200 MCG/2ML IV SOLN
INTRAVENOUS | Status: DC | PRN
  Administered 2022-03-19: 20 ug via INTRAVENOUS

## 2022-03-19 MED ORDER — PROPOFOL 500 MG/50ML IV EMUL
INTRAVENOUS | Status: DC | PRN
  Administered 2022-03-19: 200 ug/kg/min via INTRAVENOUS

## 2022-03-19 MED ORDER — PHENYLEPHRINE HCL (PRESSORS) 10 MG/ML IV SOLN
INTRAVENOUS | Status: DC | PRN
  Administered 2022-03-19: 160 ug via INTRAVENOUS

## 2022-03-19 MED ORDER — GLYCOPYRROLATE 0.2 MG/ML IJ SOLN
INTRAMUSCULAR | Status: DC | PRN
  Administered 2022-03-19: .1 mg via INTRAVENOUS

## 2022-03-19 MED ORDER — SODIUM CHLORIDE 0.9 % IV SOLN: INTRAVENOUS | Status: DC

## 2022-03-19 MED ORDER — PROPOFOL 10 MG/ML IV BOLUS
INTRAVENOUS | Status: DC | PRN
  Administered 2022-03-19: 120 mg via INTRAVENOUS

## 2022-03-19 MED ORDER — LIDOCAINE HCL (CARDIAC) PF 100 MG/5ML IV SOSY
PREFILLED_SYRINGE | INTRAVENOUS | Status: DC | PRN
  Administered 2022-03-19: 100 mg via INTRAVENOUS

## 2022-03-19 NOTE — Anesthesia Postprocedure Evaluation (Signed)
Anesthesia Post Note  Patient: Vanessa Vincent  Procedure(s) Performed: COLONOSCOPY WITH PROPOFOL  Patient location during evaluation: Endoscopy Anesthesia Type: General Level of consciousness: awake and alert Pain management: pain level controlled Vital Signs Assessment: post-procedure vital signs reviewed and stable Respiratory status: spontaneous breathing, nonlabored ventilation, respiratory function stable and patient connected to nasal cannula oxygen Cardiovascular status: blood pressure returned to baseline and stable Postop Assessment: no apparent nausea or vomiting Anesthetic complications: no   No notable events documented.   Last Vitals:  Vitals:   03/19/22 1153 03/19/22 1159  BP: (!) 83/40 97/70  Pulse:    Resp:    Temp: (!) 36.1 C   SpO2:      Last Pain:  Vitals:   03/19/22 1153  TempSrc: Temporal  PainSc:                  [name]

## 2022-03-19 NOTE — Transfer of Care (Signed)
Immediate Anesthesia Transfer of Care Note  Patient: Vanessa Vincent  Procedure(s) Performed: COLONOSCOPY WITH PROPOFOL  Patient Location: Endoscopy Unit  Anesthesia Type:General  Level of Consciousness: drowsy  Airway & Oxygen Therapy: Patient Spontanous Breathing  Post-op Assessment: Report given to RN and Post -op Vital signs reviewed and stable  Post vital signs: Reviewed and stable  Last Vitals:  Vitals Value Taken Time  BP 83/40 03/19/22 1153  Temp 36.1 C 03/19/22 1153  Pulse 51 03/19/22 1155  Resp 24 03/19/22 1155  SpO2 97 % 03/19/22 1155  Vitals shown include unvalidated device data.  Last Pain:  Vitals:   03/19/22 1153  TempSrc: Temporal  PainSc:          Complications: No notable events documented.

## 2022-03-19 NOTE — Anesthesia Preprocedure Evaluation (Signed)
Anesthesia Evaluation  Patient identified by MRN, date of birth, ID band Patient awake    Reviewed: Allergy & Precautions, NPO status , Patient's Chart, lab work & pertinent test results  History of Anesthesia Complications Negative for: history of anesthetic complications  Airway Mallampati: III  TM Distance: >3 FB Neck ROM: full    Dental  (+) Chipped   Pulmonary neg pulmonary ROS, neg shortness of breath,    Pulmonary exam normal        Cardiovascular (-) anginanegative cardio ROS Normal cardiovascular exam     Neuro/Psych  Headaches, negative psych ROS   GI/Hepatic negative GI ROS, Neg liver ROS, neg GERD  ,  Endo/Other  negative endocrine ROS  Renal/GU negative Renal ROS  negative genitourinary   Musculoskeletal   Abdominal   Peds  Hematology negative hematology ROS (+)   Anesthesia Other Findings Past Medical History: No date: Anxiety with depression 05/09/2015: Dehydration 05/16/2015: 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[place]) 05/09/2015: Hyponatremia No date: Lyme disease No date: Migraine No date: Orthodontics     Comment:  has bracees 05/09/2015: Petechiae 05/08/2015: Pneumonia No date: Pyelonephritis No date: Reflux     Comment:  as infant No date: Thrombocytopenia ([place])  Past Surgical History: 09/20/2020: CESAREAN SECTION     Comment:  Procedure: CESAREAN SECTION;  Surgeon: Malachy Mood, MD;  Location: [facility];  Service: Obstetrics;; 07/23/2016: Valarie Merino; N/A     Comment:  Procedure: SEPTOPLASTY;  Surgeon: Clyde Canterbury, MD;                Location: [place];  Service: ENT;                Laterality: N/A;     Reproductive/Obstetrics negative OB ROS                             Anesthesia Physical Anesthesia Plan  ASA: 2  Anesthesia Plan: General   Post-op Pain Management:    Induction: Intravenous  PONV Risk Score and Plan:  Propofol infusion and TIVA  Airway Management Planned: Natural Airway and Nasal Cannula  Additional Equipment:   Intra-op Plan:   Post-operative Plan:   Informed Consent: I have reviewed the patients History and Physical, chart, labs and discussed the procedure including the risks, benefits and alternatives for the proposed anesthesia with the patient or authorized representative who has indicated his/her understanding and acceptance.     Dental Advisory Given  Plan Discussed with: Anesthesiologist, CRNA and Surgeon  Anesthesia Plan Comments: (Patient consented for risks of anesthesia including but not limited to:  - adverse reactions to medications - risk of airway placement if required - damage to eyes, teeth, lips or other oral mucosa - nerve damage due to positioning  - sore throat or hoarseness - Damage to heart, brain, nerves, lungs, other parts of body or loss of life  Patient voiced understanding.)        Anesthesia Quick Evaluation

## 2022-03-19 NOTE — H&P (Signed)
Jonathon Bellows, MD [address], [place], [place], [place], [address] [address], [place], [place], [place], [address] Phone: [phone]  Fax: [phone]  Primary Care Physician:  Frazier Richards, MD   Pre-Procedure History & Physical: HPI:  Vanessa Vincent is a 21 y.o. female is here for an colonoscopy.   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   Hyponatremia 05/09/2015   Lyme disease    Migraine    Orthodontics    has bracees   Petechiae 05/09/2015   Pneumonia 05/08/2015   Pyelonephritis    Reflux    as infant   Thrombocytopenia ([facility])     Past Surgical History:  Procedure Laterality Date   CESAREAN SECTION  09/20/2020   Procedure: CESAREAN SECTION;  Surgeon: Malachy Mood, MD;  Location: [facility];  Service: Obstetrics;;   SEPTOPLASTY N/A 07/23/2016   Procedure: SEPTOPLASTY;  Surgeon: Clyde Canterbury, MD;  Location: [facility];  Service: ENT;  Laterality: N/A;   WISDOM TOOTH EXTRACTION      Prior to Admission medications   Not on File    Allergies as of 03/11/2022   (No Known Allergies)    Family History  Problem Relation Age of Onset   Juvenile Rhematoid Arthritis Sister    Lupus Mother    Fibromyalgia Mother    Hypertension Mother    Breast cancer Maternal Grandmother    Diabetes Sister    Heart disease Maternal Aunt    Cancer Maternal Aunt     Social History   Socioeconomic History   Marital status: Significant Other    Spouse name: Christy Sartorius   Number of children: Not on file   Years of education: Not on file   Highest education level: Not on file  Occupational History   Not on file  Tobacco Use   Smoking status: Never   Smokeless tobacco: Never  Vaping Use   Vaping Use: Never used  Substance and Sexual Activity   Alcohol use: No   Drug use: Not Currently    Types: Marijuana   Sexual activity: Yes    Partners: Male     Birth control/protection: None  Other Topics Concern   Not on file  Social History Narrative   Not on file   Social Determinants of Health   Financial Resource Strain: Not on file  Food Insecurity: Not on file  Transportation Needs: Not on file  Physical Activity: Not on file  Stress: Not on file  Social Connections: Not on file  Intimate Partner Violence: Not on file    Review of Systems: See HPI, otherwise negative ROS  Physical Exam: BP 115/72   Pulse 67   Temp (!) 96.9 F (36.1 C) (Temporal)   Resp 16   Ht '5\' 5"'$  (1.651 m)   Wt 81.6 kg   LMP 03/12/2022   SpO2 100%   BMI 29.95 kg/m  General:   Alert,  pleasant and cooperative in NAD Head:  Normocephalic and atraumatic. Neck:  Supple; no masses or thyromegaly. Lungs:  Clear throughout to auscultation, normal respiratory effort.    Heart:  +S1, +S2, Regular rate and rhythm, No edema. Abdomen:  Soft, nontender and nondistended. Normal bowel sounds, without guarding, and without rebound.   Neurologic:  Alert and  oriented x4;  grossly normal neurologically.  Impression/Plan: Vanessa Vincent is here for an colonoscopy to be performed  for constipation.  Risks, benefits, limitations, and alternatives regarding  colonoscopy have been reviewed with the patient.  Questions have been answered.  All parties agreeable.   Jonathon Bellows, MD  03/19/2022, 11:22 AM

## 2022-03-19 NOTE — Op Note (Signed)
[facility] Patient Name: Vanessa Vincent Procedure Date: 03/19/2022 11:27 AM MRN: [id number] Account #: [id number] Date of Birth: 02-24-2001 Admit Type: Outpatient Age: 21 Room: [facility] ENDO ROOM 2 Gender: Female Note Status: Finalized Instrument Name: Peds Colonoscope [id number] Procedure:             Colonoscopy Indications:           Constipation Providers:             Jonathon Bellows MD, MD Referring MD:          Hattie Perch. Sherril Cong (Referring MD) Medicines:             Monitored Anesthesia Care Complications:         No immediate complications. Procedure:             Pre-Anesthesia Assessment:                        - Prior to the procedure, a History and Physical was                         performed, and patient medications, allergies and                         sensitivities were reviewed. The patient's tolerance                         of previous anesthesia was reviewed.                        - The risks and benefits of the procedure and the                         sedation options and risks were discussed with the                         patient. All questions were answered and informed                         consent was obtained.                        - ASA Grade Assessment: II - A patient with mild                         systemic disease.                        After obtaining informed consent, the colonoscope was                         passed under direct vision. Throughout the procedure,                         the patient's blood pressure, pulse, and oxygen                         saturations were monitored continuously. The                         Colonoscope was introduced through the anus  and                         advanced to the the cecum, identified by the                         appendiceal orifice. The colonoscopy was performed                         with ease. The patient tolerated the procedure well.                         The  quality of the bowel preparation was good. Findings:      The perianal and digital rectal examinations were normal.      Non-bleeding internal hemorrhoids were found during retroflexion. The       hemorrhoids were small and Grade I (internal hemorrhoids that do not       prolapse).      The exam was otherwise without abnormality on direct and retroflexion       views. Impression:            - Non-bleeding internal hemorrhoids.                        - The examination was otherwise normal on direct and                         retroflexion views.                        - No specimens collected. Recommendation:        - Discharge patient to home (with escort).                        - Resume previous diet.                        - Continue present medications.                        - Return to my office PRN. Procedure Code(s):     --- Professional ---                        [phone], Colonoscopy, flexible; diagnostic, including                         collection of specimen(s) by brushing or washing, when                         performed (separate procedure) Diagnosis Code(s):     --- Professional ---                        K64.0, First degree hemorrhoids                        K59.00, Constipation, unspecified CPT copyright 2019 American Medical Association. All rights reserved. The codes documented in this report are preliminary and upon coder review may  be revised to meet current compliance requirements. Jonathon Bellows, MD Jonathon Bellows MD, MD 03/19/2022 11:52:38 AM This report  has been signed electronically. Number of Addenda: 0 Note Initiated On: 03/19/2022 11:27 AM Scope Withdrawal Time: 0 hours 8 minutes 20 seconds  Total Procedure Duration: 0 hours 10 minutes 57 seconds  Estimated Blood Loss:  Estimated blood loss: none.      [facility]

## 2022-03-20 ENCOUNTER — Encounter: Payer: Self-pay | Admitting: Gastroenterology

## 2022-04-01 ENCOUNTER — Encounter: Payer: Self-pay | Admitting: Gastroenterology

## 2022-04-01 ENCOUNTER — Other Ambulatory Visit: Payer: Self-pay

## 2022-04-01 MED ORDER — LINACLOTIDE 290 MCG PO CAPS: 290.0000 ug | ORAL_CAPSULE | Freq: Every day | ORAL | 3 refills | Status: DC

## 2022-04-16 ENCOUNTER — Ambulatory Visit (INDEPENDENT_AMBULATORY_CARE_PROVIDER_SITE_OTHER): Payer: Medicaid Other | Admitting: Obstetrics and Gynecology

## 2022-04-16 ENCOUNTER — Encounter: Payer: Self-pay | Admitting: Obstetrics and Gynecology

## 2022-04-16 VITALS — BP 115/76 | HR 71

## 2022-04-16 DIAGNOSIS — R350 Frequency of micturition: Secondary | ICD-10-CM | POA: Diagnosis not present

## 2022-04-16 DIAGNOSIS — R3989 Other symptoms and signs involving the genitourinary system: Secondary | ICD-10-CM | POA: Diagnosis not present

## 2022-05-08 ENCOUNTER — Ambulatory Visit: Payer: Medicaid Other | Admitting: Physical Therapy

## 2022-05-15 ENCOUNTER — Ambulatory Visit: Payer: Medicaid Other | Admitting: Physical Therapy

## 2022-05-22 ENCOUNTER — Ambulatory Visit: Payer: Medicaid Other | Attending: Obstetrics and Gynecology | Admitting: Physical Therapy

## 2022-05-22 ENCOUNTER — Encounter: Payer: Self-pay | Admitting: Physical Therapy

## 2022-05-22 DIAGNOSIS — R278 Other lack of coordination: Secondary | ICD-10-CM | POA: Diagnosis present

## 2022-05-22 DIAGNOSIS — M533 Sacrococcygeal disorders, not elsewhere classified: Secondary | ICD-10-CM | POA: Diagnosis present

## 2022-05-22 DIAGNOSIS — R2689 Other abnormalities of gait and mobility: Secondary | ICD-10-CM | POA: Insufficient documentation

## 2022-05-22 NOTE — Patient Instructions (Signed)
   Clam Shell 45 Degrees  Lying with hips and knees bent 45, one pillow between knees and ankles. Heel together, toes apart like ballerina,  Lift knee with exhale while pressing heels together. Be sure pelvis does not roll backward. Do not arch back. Do 20 times, each leg, 2 times per day.    Complimentary stretch: Figure-4   3 breaths    __   Practice holding baby with equal weight on both legs , not hip shifted

## 2022-05-22 NOTE — Therapy (Signed)
OUTPATIENT PHYSICAL THERAPY EVALUATION   Patient Name: Vanessa Vincent MRN: [id number] DOB:Sep 19, 2001, 21 y.o., female Today's Date: 05/23/2022   PT End of Session - 05/22/22 1557     Visit Number 1    Number of Visits 10    Date for PT Re-Evaluation 07/31/22    PT Start Time 1937    PT Stop Time 1635    PT Time Calculation (min) 40 min    Activity Tolerance Patient tolerated treatment well    Behavior During Therapy [facility] for tasks assessed/performed             Past Medical History:  Diagnosis Date   Anxiety with depression    Dehydration 05/09/2015   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[place]) 05/16/2015   Hyponatremia 05/09/2015   Lyme disease    Migraine    Orthodontics    has bracees   Petechiae 05/09/2015   Pneumonia 05/08/2015   Pyelonephritis    Reflux    as infant   Thrombocytopenia ([place])    Past Surgical History:  Procedure Laterality Date   CESAREAN SECTION  09/20/2020   Procedure: CESAREAN SECTION;  Surgeon: Malachy Mood, MD;  Location: [facility] ORS;  Service: Obstetrics;;   COLONOSCOPY WITH PROPOFOL N/A 03/19/2022   Procedure: COLONOSCOPY WITH PROPOFOL;  Surgeon: Jonathon Bellows, MD;  Location: [facility] ENDOSCOPY;  Service: Gastroenterology;  Laterality: N/A;   SEPTOPLASTY N/A 07/23/2016   Procedure: SEPTOPLASTY;  Surgeon: Clyde Canterbury, MD;  Location: [place];  Service: ENT;  Laterality: N/A;   WISDOM TOOTH EXTRACTION     Patient Active Problem List   Diagnosis Date Noted   Chronic constipation    Anxiety and depression 02/07/2020   History of pyelonephritis 05/23/2016    PCP: Sherril Cong, MD   REFERRING PROVIDER:   Wannetta Sender MD   REFERRING DIAG:   K59.00 (ICD-10-CM) - Constipation, unspecified constipation type  M62.838 (ICD-10-CM) - Levator spasm  R39.89 (ICD-10-CM) - Bladder pain    Rationale for Evaluation and Treatment Rehabilitation  THERAPY DIAG:  Sacrococcygeal pain - Plan: PT plan of care cert/re-cert  Other abnormalities of gait  and mobility - Plan: PT plan of care cert/re-cert  Other lack of coordination - Plan: PT plan of care cert/re-cert  ONSET DATE:   SUBJECTIVE:                                                                                                                                                                                           SUBJECTIVE STATEMENT: 1) Pt went to a MD in [place] with pelvic pain which felt like  UTI. Test came back neg. Another MD prescribed her  medication and was referred to Pelvic PT. Pelvic pain requires hot shower and heating pad but the pain does not go away. Medication does not work either. Pt notices burning with urination. Pain is located at the front of vagina. Denied pressured sensation, bulging out.   Pt is not able to empty urination completely. Freqeuncy: Nocturia: 3-5 x night, daytime: 2-3 x within 2 hours.   Denied SUI, fecal leakage, pain with intercourse.    2) LBP occurs sporadically. Sharp pain in the L LBP and radiates to calf when she is on her feet more. LBP occurs with walking after 1 hour    PERTINENT HISTORY:  C-section, dtr is 20 months. Denied fall onto tailbone. Hx of constipation since middle school.   PAIN:  Are you having pain? No  PRECAUTIONS: None  WEIGHT BEARING RESTRICTIONS No  FALLS:  Has patient fallen in last 6 months? No  LIVING ENVIRONMENT: Lives with:  lives with their partner Lives in: House/apartment Stairs: Yes: External: 5 steps; can reach both   OCCUPATION:   stay home mom   PLOF: Independent  PATIENT GOALS   Using bathroom less during the night and not getting the Sx as much    OBJECTIVE:    [facility] PT Assessment - 05/22/22 1620       AROM   Overall AROM Comments L sidebend with reproduction of pain at L PSIS  , but no radiating pain ( post Tx: decreased pain with L sidebend )      Strength   Overall Strength Comments R hip abd 4-/5, L 3+/5      Palpation   Spinal mobility L sidebend with  reproduction of pain at L PSIS  , but no radiating pain    SI assessment  L iliac crest higher ( post Tx: levelled)    Palpation comment c section scar fascial restrictions below scar      Ambulation/Gait   Gait velocity 0.68 m.s    Gait Comments pre Tx: L hip hike, decreased R stance phase, limited trunk rotation               [facility] Adult PT Treatment/Exercise - 05/23/22 0840       Ambulation/Gait   Gait velocity 0.68 m.s    Gait Comments pre Tx: L hip hike, decreased R stance phase, limited trunk rotation      Neuro Re-ed    Neuro Re-ed Details  cued for log rolling and thoracic mobility HEP to promote levelled alignment      Manual Therapy   Manual therapy comments STM/MWM at L SIJ to promote mobility                 HOME EXERCISE PROGRAM: See pt instruction section    ASSESSMENT:  CLINICAL IMPRESSION:  Pt is a 21 yo who presents with frequent urination at night and during the day, burning with urination, CLBP  which impact her ADLs and QOL.   Pt's musculoskeletal assessment revealed higher R shoulder, L iliac crest, hip abduction weakness, dyscoordination and strength of pelvic floor mm, slumped posture, and slowed gait  speed, poor body mechanics which places strain on the abdominal/pelvic floor mm.   These are deficits that indicate an ineffective intraabdominal pressure system associated with increased risk for pt's Sx.   Pt was provided education on etiology of Sx with anatomy, physiology explanation with images along with the benefits of customized pelvic PT Tx based on pt's medical conditions and musculoskeletal deficits.  Explained the physiology of deep core mm coordination and roles of pelvic floor function in urination, defecation, sexual function, and postural control with deep core mm system.   Following Tx today which pt tolerated without complaints, pt demo'd equal alignment of pelvic girdle and increased spinal mobility. Pt reported minimal LBP  with L sidebend. Plan to add deep core strengthening at next session and body mechanics training.  Pt benefits from skilled PT.     OBJECTIVE IMPAIRMENTS Abnormal gait, decreased coordination, decreased mobility, decreased ROM, decreased strength, decreased safety awareness, hypomobility, increased fascial restrictions, increased muscle spasms, impaired flexibility, improper body mechanics, postural dysfunction, and pain.   ACTIVITY LIMITATIONS carrying, continence, toileting, and locomotion level  PARTICIPATION LIMITATIONS: interpersonal relationship and occupation  [place] Past/current experiences (C section scar, post partum )  are also affecting patient's functional outcome.   REHAB POTENTIAL: Good  CLINICAL DECISION MAKING: Evolving/moderate complexity  EVALUATION COMPLEXITY: Moderate   PATIENT EDUCATION:   Education details: Showed pt anatomy images. Explained muscles attachments/ connection, physiology of deep core system/ spinal- thoracic-pelvis-lower kinetic chain as they relate to pt's presentation, Sx, and past Hx. Explained what and how these areas of deficits need to be restored to balance and function   See Therapeutic activity / neuromuscular re-education section   Person educated: Patient Education method: Explanation, Demonstration, Tactile cues, Verbal cues, and Handouts Education comprehension: verbalized understanding, returned demonstration, verbal cues required, tactile cues required, and needs further education   PLAN: PT FREQUENCY: 1x/week  PT DURATION: 10 weeks  PLANNED INTERVENTIONS: Therapeutic exercises, Therapeutic activity, Neuromuscular re-education, Balance training, Gait training, Patient/Family education, Self Care, Joint mobilization, Stair training, Dry Needling, Spinal mobilization, Moist heat, scar mobilization, Taping, and Manual therapy.  PLAN FOR NEXT SESSION: See clinical impression for plan     GOALS: Goals reviewed with  patient? Yes  SHORT TERM GOALS: Target date: 06/20/2022  Pt will demo IND with HEP Baseline: Not IND   Goal status: INITIAL    LONG TERM GOALS: Target date: 08/01/2022  Pt will report decreased frequency nocturia to < 2 trips, 1 x within every 2 hours during the day  Baseline: nightime 3-5, daytime 2-3 within 2 hours Goal status: INITIAL  2.  Pt will demo decreased C section scar restrictions in order to progress deep core strengthening   Baseline:  restricted  Goal status: INITIAL  3.  Pt will demo proper deep core coordination without chest breathing and optimal excursion of diaphragm/pelvic floor in order to promote spinal stability and pelvic floor function   Baseline: chest breathing  Goal status: INITIAL  4.  Pt will demo > 5 pt change on FOTO  to improve QOL and function PFDI urinary /pelvic pain  baseline  42 pts Urinary Problem  63 pts   Goal status: INITIAL  5.  Pt will demo > 5 pt change on FOTO Lumbar  to improve QOL and function   Baseline: 47 pts Goal status: INITIAL  6.  Pt will demo levelled pelvic girdle and shoulder height in order to progress to deep core strengthening HEP and restore mobility at spine, pelvis, gait, posture   Baseline: R shoulder, L iliac crest higher  Goal status: INITIAL  7. Pt will demo increased gait speed > 1.3 m/s in order to ambulate safely in community and return to fitness routine  Baseline: 0.68 m/s   Goal status: INITIAL   Jerl Mina, PT 05/23/2022, 8:42 AM

## 2022-05-29 ENCOUNTER — Ambulatory Visit: Payer: Medicaid Other | Admitting: Physical Therapy

## 2022-05-29 DIAGNOSIS — M533 Sacrococcygeal disorders, not elsewhere classified: Secondary | ICD-10-CM

## 2022-05-29 DIAGNOSIS — R278 Other lack of coordination: Secondary | ICD-10-CM

## 2022-05-29 DIAGNOSIS — R2689 Other abnormalities of gait and mobility: Secondary | ICD-10-CM

## 2022-05-29 NOTE — Patient Instructions (Signed)
   Side of hip/ midback  stretch:  Reclined twist for hips and side of the hips/ legs  Lay on your back, knees bend Scoot hips to the R , leave shoulders in place Drop knees to the L side, back and forth 10-15 reps   And then if you want to rest for 5 breaths in stillness with legs  resting onto pillows to keep leg at the same width of hips   Switch sides   ___  Angels wings, dragging forearms on bed,  20 reps   ___  6 directions of neck  With rolled towel under neck   ___

## 2022-05-29 NOTE — Therapy (Signed)
OUTPATIENT PHYSICAL THERAPY Treatment   Patient Name: Vanessa Vincent MRN: [id number] DOB:[birth year], 21 y.o., female Today's Date: 05/29/2022   PT End of Session - 05/29/22 1553     Visit Number 2    Number of Visits 10    Date for PT Re-Evaluation 07/31/22    PT Start Time 1550    PT Stop Time 1630    PT Time Calculation (min) 40 min    Activity Tolerance Patient tolerated treatment well    Behavior During Therapy [facility] for tasks assessed/performed             Past Medical History:  Diagnosis Date   Anxiety with depression    Dehydration 05/09/2015   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[place]) 05/16/2015   Hyponatremia 05/09/2015   Lyme disease    Migraine    Orthodontics    has bracees   Petechiae 05/09/2015   Pneumonia 05/08/2015   Pyelonephritis    Reflux    as infant   Thrombocytopenia ([place])    Past Surgical History:  Procedure Laterality Date   CESAREAN SECTION  09/20/2020   Procedure: CESAREAN SECTION;  Surgeon: Malachy Mood, MD;  Location: [facility] ORS;  Service: Obstetrics;;   COLONOSCOPY WITH PROPOFOL N/A 03/19/2022   Procedure: COLONOSCOPY WITH PROPOFOL;  Surgeon: Jonathon Bellows, MD;  Location: [facility] ENDOSCOPY;  Service: Gastroenterology;  Laterality: N/A;   SEPTOPLASTY N/A 07/23/2016   Procedure: SEPTOPLASTY;  Surgeon: Clyde Canterbury, MD;  Location: [place];  Service: ENT;  Laterality: N/A;   WISDOM TOOTH EXTRACTION     Patient Active Problem List   Diagnosis Date Noted   Chronic constipation    Anxiety and depression 02/07/2020   History of pyelonephritis 05/23/2016    PCP: Sherril Cong, MD   REFERRING PROVIDER:   Wannetta Sender MD   REFERRING DIAG:   K59.00 (ICD-10-CM) - Constipation, unspecified constipation type  M62.838 (ICD-10-CM) - Levator spasm  R39.89 (ICD-10-CM) - Bladder pain    Rationale for Evaluation and Treatment Rehabilitation  THERAPY DIAG:  Sacrococcygeal pain  Other abnormalities of gait and mobility  Other lack of  coordination  ONSET DATE:   SUBJECTIVE:                                                                                                                                                                                           SUBJECTIVE STATEMENT: 1) Sx:   not able to empty urination completely. Freqeuncy: Nocturia: 3-5 x night, daytime: 2-3 x within 2 hours:    No changes compared to last week    2) Sx: LBP:  This week, pt notices the clam shell exercises  are helping with her LBP    PERTINENT HISTORY:  C-section, dtr is 20 months. Denied fall onto tailbone. Hx of constipation since middle school.   PAIN:  Are you having pain? No  PRECAUTIONS: None  WEIGHT BEARING RESTRICTIONS No  FALLS:  Has patient fallen in last 6 months? No  LIVING ENVIRONMENT: Lives with:  lives with their partner Lives in: House/apartment Stairs: Yes: External: 5 steps; can reach both   OCCUPATION:   stay home mom   PLOF: Independent  PATIENT GOALS   Using bathroom less during the night and not getting the Sx as much    OBJECTIVE:      [facility] PT Assessment - 05/29/22 1557       Coordination   Coordination and Movement Description limited lateral excursionof diaphragm      Palpation   Spinal mobility L sidebend,no radiating pain    SI assessment  levelled pelvic girdle    Palpation comment tightness and tendernss along medial. posterior scapula, intercostals, paraspinals, upper trap. levator R      Ambulation/Gait   Gait velocity 1.6 m/s    Gait Comments reciporcal pelvic movements, minimal arm swing/ trunk rotation             [facility] Adult PT Treatment/Exercise - 05/29/22 1557       Neuro Re-ed    Neuro Re-ed Details  cued for thoracic mobilty, cued for arm swings in gait, relaxation to decrease R upper trap tightness      Manual Therapy   Manual therapy comments STM/MWM at R thoracic region to promote mobility/ diaphragmatic excursion                 HOME  EXERCISE PROGRAM: See pt instruction section    ASSESSMENT:  CLINICAL IMPRESSION:  Pt demo'd improved pelvic girdle alignment. Gait speed improved significantly but pt lacked arm swing. Thoracic mobility and  R shoulder remained elevated. Thoracic mobility techniques were applied with manual Tx and HEP focused on improving this area to help optimize diaphragmatic excursion and lower R shoulder. Plan to progress to deep core training and assess C-section scar restrictions to improve Sx .    Pt benefits from skilled PT.     OBJECTIVE IMPAIRMENTS Abnormal gait, decreased coordination, decreased mobility, decreased ROM, decreased strength, decreased safety awareness, hypomobility, increased fascial restrictions, increased muscle spasms, impaired flexibility, improper body mechanics, postural dysfunction, and pain.   ACTIVITY LIMITATIONS carrying, continence, toileting, and locomotion level  PARTICIPATION LIMITATIONS: interpersonal relationship and occupation  [place] Past/current experiences (C section scar, post partum )  are also affecting patient's functional outcome.   REHAB POTENTIAL: Good  CLINICAL DECISION MAKING: Evolving/moderate complexity  EVALUATION COMPLEXITY: Moderate   PATIENT EDUCATION:   Education details: Showed pt anatomy images. Explained muscles attachments/ connection, physiology of deep core system/ spinal- thoracic-pelvis-lower kinetic chain as they relate to pt's presentation, Sx, and past Hx. Explained what and how these areas of deficits need to be restored to balance and function   See Therapeutic activity / neuromuscular re-education section   Person educated: Patient Education method: Explanation, Demonstration, Tactile cues, Verbal cues, and Handouts Education comprehension: verbalized understanding, returned demonstration, verbal cues required, tactile cues required, and needs further education   PLAN: PT FREQUENCY: 1x/week  PT  DURATION: 10 weeks  PLANNED INTERVENTIONS: Therapeutic exercises, Therapeutic activity, Neuromuscular re-education, Balance training, Gait training, Patient/Family education, Self Care, Joint mobilization, Stair training, Dry Needling, Spinal mobilization, Moist heat, scar mobilization, Taping, and Manual therapy.  PLAN FOR NEXT SESSION: See clinical impression for plan     GOALS: Goals reviewed with patient? Yes  SHORT TERM GOALS: Target date: 06/26/2022  Pt will demo IND with HEP Baseline: Not IND   Goal status: INITIAL    LONG TERM GOALS: Target date: 08/07/2022  Pt will report decreased frequency nocturia to < 2 trips, 1 x within every 2 hours during the day  Baseline: nightime 3-5, daytime 2-3 within 2 hours Goal status: INITIAL  2.  Pt will demo decreased C section scar restrictions in order to progress deep core strengthening   Baseline:  restricted  Goal status: INITIAL  3.  Pt will demo proper deep core coordination without chest breathing and optimal excursion of diaphragm/pelvic floor in order to promote spinal stability and pelvic floor function   Baseline: chest breathing  Goal status: INITIAL  4.  Pt will demo > 5 pt change on FOTO  to improve QOL and function PFDI urinary /pelvic pain  baseline  42 pts Urinary Problem  63 pts   Goal status: INITIAL  5.  Pt will demo > 5 pt change on FOTO Lumbar  to improve QOL and function   Baseline: 47 pts Goal status: INITIAL  6.  Pt will demo levelled pelvic girdle and shoulder height in order to progress to deep core strengthening HEP and restore mobility at spine, pelvis, gait, posture   Baseline: R shoulder, L iliac crest higher  Goal status: INITIAL  7. Pt will demo increased gait speed > 1.3 m/s in order to ambulate safely in community and return to fitness routine  Baseline: 0.68 m/s   Goal status: INITIAL   Jerl Mina, PT 05/29/2022, 3:54 PM

## 2022-06-04 ENCOUNTER — Ambulatory Visit: Payer: Medicaid Other | Admitting: Physical Therapy

## 2022-06-06 ENCOUNTER — Ambulatory Visit: Payer: Medicaid Other | Admitting: Physical Therapy

## 2022-06-06 DIAGNOSIS — R278 Other lack of coordination: Secondary | ICD-10-CM

## 2022-06-06 DIAGNOSIS — M533 Sacrococcygeal disorders, not elsewhere classified: Secondary | ICD-10-CM

## 2022-06-06 DIAGNOSIS — R2689 Other abnormalities of gait and mobility: Secondary | ICD-10-CM

## 2022-06-06 NOTE — Therapy (Signed)
OUTPATIENT PHYSICAL THERAPY Treatment   Patient Name: Vanessa Vincent MRN: [id number] DOB:2001-02-21, 21 y.o., female Today's Date: 06/06/2022   PT End of Session - 06/06/22 1637     Visit Number 3    Number of Visits 10    Date for PT Re-Evaluation 07/31/22    PT Start Time 1635    PT Stop Time 1715    PT Time Calculation (min) 40 min             Past Medical History:  Diagnosis Date   Anxiety with depression    Dehydration 05/09/2015   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[place]) 05/16/2015   Hyponatremia 05/09/2015   Lyme disease    Migraine    Orthodontics    has bracees   Petechiae 05/09/2015   Pneumonia 05/08/2015   Pyelonephritis    Reflux    as infant   Thrombocytopenia ([place])    Past Surgical History:  Procedure Laterality Date   CESAREAN SECTION  09/20/2020   Procedure: CESAREAN SECTION;  Surgeon: Malachy Mood, MD;  Location: [facility] ORS;  Service: Obstetrics;;   COLONOSCOPY WITH PROPOFOL N/A 03/19/2022   Procedure: COLONOSCOPY WITH PROPOFOL;  Surgeon: Jonathon Bellows, MD;  Location: [facility] ENDOSCOPY;  Service: Gastroenterology;  Laterality: N/A;   SEPTOPLASTY N/A 07/23/2016   Procedure: SEPTOPLASTY;  Surgeon: Clyde Canterbury, MD;  Location: [place];  Service: ENT;  Laterality: N/A;   WISDOM TOOTH EXTRACTION     Patient Active Problem List   Diagnosis Date Noted   Chronic constipation    Anxiety and depression 02/07/2020   History of pyelonephritis 05/23/2016    PCP: Sherril Cong, MD   REFERRING PROVIDER:   Wannetta Sender MD   REFERRING DIAG:   K59.00 (ICD-10-CM) - Constipation, unspecified constipation type  M62.838 (ICD-10-CM) - Levator spasm  R39.89 (ICD-10-CM) - Bladder pain    Rationale for Evaluation and Treatment Rehabilitation  THERAPY DIAG:  Sacrococcygeal pain  Other abnormalities of gait and mobility  Other lack of coordination  ONSET DATE:   SUBJECTIVE:                                                                                                                                                                                            SUBJECTIVE STATEMENT: 1) Sx:   not able to empty urination completely. Freqeuncy: Nocturia: 3-5 x night, daytime: 2-3 x within 2 hours:    No changes compared to last week    2) Sx: LBP:  This week, pt notices no more radiating LBP   PERTINENT HISTORY:  C-section, dtr is 20 months. Denied fall onto tailbone. Hx of constipation since middle  school.   PAIN:  Are you having pain? No  PRECAUTIONS: None  WEIGHT BEARING RESTRICTIONS No  FALLS:  Has patient fallen in last 6 months? No  LIVING ENVIRONMENT: Lives with:  lives with their partner Lives in: House/apartment Stairs: Yes: External: 5 steps; can reach both   OCCUPATION:   stay home mom   PLOF: Independent  PATIENT GOALS   Using bathroom less during the night and not getting the Sx as much    OBJECTIVE:    [facility] PT Assessment - 06/06/22 1721       Palpation   SI assessment  levelled pelvic girdle    Palpation comment C section scar supra pubic area restrictions              [facility] PT Assessment - 06/06/22 1721       Palpation   SI assessment  levelled pelvic girdle    Palpation comment C section scar supra pubic area restrictions              [facility] Adult PT Treatment/Exercise - 06/06/22 1722       Neuro Re-ed    Neuro Re-ed Details  cued for pelvic tilts, deep core elvel 1-2, self C section scar releases      Modalities   Modalities Moist Heat      Moist Heat Therapy   Number Minutes Moist Heat 5 Minutes    Moist Heat Location --   abdomen, unbilled     Manual Therapy   Manual therapy comments fascial releases with MWM at C section scar supra pubic area                HOME EXERCISE PROGRAM: See pt instruction section    ASSESSMENT:  CLINICAL IMPRESSION:  Pt's radiating LBP has resolved. Addressed C section scar restrictions today . Modified technique when pt expressed  tenderness. Pt tolerated manual Tx without increased pain/ complaints. Pt progressed deep core HEP with cues for less abdominal overuse and more diaphragmatic excursion. Pt reported feeling less pain in her lower half of her body. PLan to review deep core coordination and assess pelvic floor at next session  Pt benefits from skilled PT.     OBJECTIVE IMPAIRMENTS Abnormal gait, decreased coordination, decreased mobility, decreased ROM, decreased strength, decreased safety awareness, hypomobility, increased fascial restrictions, increased muscle spasms, impaired flexibility, improper body mechanics, postural dysfunction, and pain.   ACTIVITY LIMITATIONS carrying, continence, toileting, and locomotion level  PARTICIPATION LIMITATIONS: interpersonal relationship and occupation  [place] Past/current experiences (C section scar, post partum )  are also affecting patient's functional outcome.   REHAB POTENTIAL: Good  CLINICAL DECISION MAKING: Evolving/moderate complexity  EVALUATION COMPLEXITY: Moderate   PATIENT EDUCATION:   Education details: Showed pt anatomy images. Explained muscles attachments/ connection, physiology of deep core system/ spinal- thoracic-pelvis-lower kinetic chain as they relate to pt's presentation, Sx, and past Hx. Explained what and how these areas of deficits need to be restored to balance and function   See Therapeutic activity / neuromuscular re-education section   Person educated: Patient Education method: Explanation, Demonstration, Tactile cues, Verbal cues, and Handouts Education comprehension: verbalized understanding, returned demonstration, verbal cues required, tactile cues required, and needs further education   PLAN: PT FREQUENCY: 1x/week  PT DURATION: 10 weeks  PLANNED INTERVENTIONS: Therapeutic exercises, Therapeutic activity, Neuromuscular re-education, Balance training, Gait training, Patient/Family education, Self Care, Joint  mobilization, Stair training, Dry Needling, Spinal mobilization, Moist heat, scar mobilization, Taping, and Manual therapy.  PLAN  FOR NEXT SESSION: See clinical impression for plan     GOALS: Goals reviewed with patient? Yes  SHORT TERM GOALS: Target date: 07/04/2022  Pt will demo IND with HEP Baseline: Not IND   Goal status: INITIAL    LONG TERM GOALS: Target date: 08/15/2022  Pt will report decreased frequency nocturia to < 2 trips, 1 x within every 2 hours during the day  Baseline: nightime 3-5, daytime 2-3 within 2 hours Goal status: INITIAL  2.  Pt will demo decreased C section scar restrictions in order to progress deep core strengthening   Baseline:  restricted  Goal status: INITIAL  3.  Pt will demo proper deep core coordination without chest breathing and optimal excursion of diaphragm/pelvic floor in order to promote spinal stability and pelvic floor function   Baseline: chest breathing  Goal status: INITIAL  4.  Pt will demo > 5 pt change on FOTO  to improve QOL and function PFDI urinary /pelvic pain  baseline  42 pts Urinary Problem  63 pts   Goal status: INITIAL  5.  Pt will demo > 5 pt change on FOTO Lumbar  to improve QOL and function   Baseline: 47 pts Goal status: INITIAL  6.  Pt will demo levelled pelvic girdle and shoulder height in order to progress to deep core strengthening HEP and restore mobility at spine, pelvis, gait, posture   Baseline: R shoulder, L iliac crest higher  Goal status: INITIAL  7. Pt will demo increased gait speed > 1.3 m/s in order to ambulate safely in community and return to fitness routine  Baseline: 0.68 m/s   Goal status: INITIAL   Jerl Mina, PT 06/06/2022, 5:23 PM

## 2022-06-06 NOTE — Patient Instructions (Addendum)
Abdominal massage upward from L,  R , center to belly button 3 stroke x 3 , pressure is gentle and light with all fingers flat, not using finger tips     Deep core level 1-2 ( handout)

## 2022-06-12 ENCOUNTER — Ambulatory Visit: Payer: Medicaid Other | Admitting: Physical Therapy

## 2022-06-12 DIAGNOSIS — M533 Sacrococcygeal disorders, not elsewhere classified: Secondary | ICD-10-CM | POA: Diagnosis not present

## 2022-06-12 DIAGNOSIS — R2689 Other abnormalities of gait and mobility: Secondary | ICD-10-CM

## 2022-06-12 DIAGNOSIS — R278 Other lack of coordination: Secondary | ICD-10-CM

## 2022-06-12 NOTE — Therapy (Signed)
OUTPATIENT PHYSICAL THERAPY Treatment   Patient Name: Vanessa Vincent MRN: [id number] DOB:[birth year], 21 y.o., female Today's Date: 06/12/2022   PT End of Session - 06/12/22 1559     Visit Number 4    Number of Visits 10    Date for PT Re-Evaluation 07/31/22    PT Start Time 1550    PT Stop Time 1630    PT Time Calculation (min) 40 min    Activity Tolerance Patient tolerated treatment well    Behavior During Therapy [facility] for tasks assessed/performed             Past Medical History:  Diagnosis Date   Anxiety with depression    Dehydration 05/09/2015   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[place]) 05/16/2015   Hyponatremia 05/09/2015   Lyme disease    Migraine    Orthodontics    has bracees   Petechiae 05/09/2015   Pneumonia 05/08/2015   Pyelonephritis    Reflux    as infant   Thrombocytopenia ([place])    Past Surgical History:  Procedure Laterality Date   CESAREAN SECTION  09/20/2020   Procedure: CESAREAN SECTION;  Surgeon: Malachy Mood, MD;  Location: [facility] ORS;  Service: Obstetrics;;   COLONOSCOPY WITH PROPOFOL N/A 03/19/2022   Procedure: COLONOSCOPY WITH PROPOFOL;  Surgeon: Jonathon Bellows, MD;  Location: [facility] ENDOSCOPY;  Service: Gastroenterology;  Laterality: N/A;   SEPTOPLASTY N/A 07/23/2016   Procedure: SEPTOPLASTY;  Surgeon: Clyde Canterbury, MD;  Location: [place];  Service: ENT;  Laterality: N/A;   WISDOM TOOTH EXTRACTION     Patient Active Problem List   Diagnosis Date Noted   Chronic constipation    Anxiety and depression 02/07/2020   History of pyelonephritis 05/23/2016    PCP: Sherril Cong, MD   REFERRING PROVIDER:   Wannetta Sender MD   REFERRING DIAG:   K59.00 (ICD-10-CM) - Constipation, unspecified constipation type  M62.838 (ICD-10-CM) - Levator spasm  R39.89 (ICD-10-CM) - Bladder pain    Rationale for Evaluation and Treatment Rehabilitation  THERAPY DIAG:  Sacrococcygeal pain  Other abnormalities of gait and mobility  Other lack of  coordination  ONSET DATE:   SUBJECTIVE:                                                                                                                                                                                           SUBJECTIVE STATEMENT: 1) Urinary Sx: no addressed today    2) Sx: LBP:  Last Friday, pt's L LBP came back to back of knee but it went away at the end of the night after she placed heat on it.    PERTINENT HISTORY:  C-section, dtr is 20 months. Denied fall onto tailbone. Hx of constipation since middle school.   PAIN:  Are you having pain? No  PRECAUTIONS: None  WEIGHT BEARING RESTRICTIONS No  FALLS:  Has patient fallen in last 6 months? No  LIVING ENVIRONMENT: Lives with:  lives with their partner Lives in: House/apartment Stairs: Yes: External: 5 steps; can reach both   OCCUPATION:   stay home mom   PLOF: Independent  PATIENT GOALS   Using bathroom less during the night and not getting the Sx as much    OBJECTIVE:    [facility] PT Assessment - 06/12/22 1602       Coordination   Coordination and Movement Description poor technique with deep core level 2      AROM   Overall AROM Comments figure-4 : 62 cm L, 60 cm from lateral tib plat to floor ( post Tx: 60 cm on L )      Palpation   Palpation comment FADDIR more limited on L SIJ > R ( post Tx: improved mobility)  , increased tightness/ tenderness at L midfoot joints, L SIJ by ischial tuberosity / glut med L                 [facility] Adult PT Treatment/Exercise - 06/12/22 1709       Neuro Re-ed    Neuro Re-ed Details  cued for deep core level 2 technique, hip stretches      Modalities   Modalities Moist Heat      Moist Heat Therapy   Number Minutes Moist Heat 5 Minutes    Moist Heat Location --   sacrum during instruction of deep core levle 1-2  )     Manual Therapy   Manual therapy comments STM/MWM at L SIJ, foot to promote toe abduction/ DF/EV, hip flex/ER/ abd                  HOME EXERCISE PROGRAM: See pt instruction section    ASSESSMENT:  CLINICAL IMPRESSION:  Pt had a relapse of L radiating pain last week but it subsided with heat. Pt showed today, increased hypomobility SIJ and lower kinetic chain with limited hip flex/ER/abd compared to R. Pt showed increased hip / SIJ / foot mobility post Tx.  Pt required cues for deep core level 2 technique for more pelvic stability. Pt demo'd correctly post Tx.   Plan to further address pelvic floor to address LBP and urinary issues. Add multidifis strengthening next session  Pt benefits from skilled PT.     OBJECTIVE IMPAIRMENTS Abnormal gait, decreased coordination, decreased mobility, decreased ROM, decreased strength, decreased safety awareness, hypomobility, increased fascial restrictions, increased muscle spasms, impaired flexibility, improper body mechanics, postural dysfunction, and pain.   ACTIVITY LIMITATIONS carrying, continence, toileting, and locomotion level  PARTICIPATION LIMITATIONS: interpersonal relationship and occupation  [place] Past/current experiences (C section scar, post partum )  are also affecting patient's functional outcome.   REHAB POTENTIAL: Good  CLINICAL DECISION MAKING: Evolving/moderate complexity  EVALUATION COMPLEXITY: Moderate   PATIENT EDUCATION:   Education details: Showed pt anatomy images. Explained muscles attachments/ connection, physiology of deep core system/ spinal- thoracic-pelvis-lower kinetic chain as they relate to pt's presentation, Sx, and past Hx. Explained what and how these areas of deficits need to be restored to balance and function   See Therapeutic activity / neuromuscular re-education section   Person educated: Patient Education method: Explanation, Demonstration, Tactile cues, Verbal cues, and Handouts Education comprehension: verbalized understanding,  returned demonstration, verbal cues required, tactile cues required,  and needs further education   PLAN: PT FREQUENCY: 1x/week  PT DURATION: 10 weeks  PLANNED INTERVENTIONS: Therapeutic exercises, Therapeutic activity, Neuromuscular re-education, Balance training, Gait training, Patient/Family education, Self Care, Joint mobilization, Stair training, Dry Needling, Spinal mobilization, Moist heat, scar mobilization, Taping, and Manual therapy.  PLAN FOR NEXT SESSION: See clinical impression for plan     GOALS: Goals reviewed with patient? Yes  SHORT TERM GOALS: Target date: 07/10/2022  Pt will demo IND with HEP Baseline: Not IND   Goal status: INITIAL    LONG TERM GOALS: Target date: 08/21/2022  Pt will report decreased frequency nocturia to < 2 trips, 1 x within every 2 hours during the day  Baseline: nightime 3-5, daytime 2-3 within 2 hours Goal status: INITIAL  2.  Pt will demo decreased C section scar restrictions in order to progress deep core strengthening   Baseline:  restricted  Goal status: INITIAL  3.  Pt will demo proper deep core coordination without chest breathing and optimal excursion of diaphragm/pelvic floor in order to promote spinal stability and pelvic floor function   Baseline: chest breathing  Goal status: INITIAL  4.  Pt will demo > 5 pt change on FOTO  to improve QOL and function PFDI urinary /pelvic pain  baseline  42 pts Urinary Problem  63 pts   Goal status: INITIAL  5.  Pt will demo > 5 pt change on FOTO Lumbar  to improve QOL and function   Baseline: 47 pts Goal status: INITIAL  6.  Pt will demo levelled pelvic girdle and shoulder height in order to progress to deep core strengthening HEP and restore mobility at spine, pelvis, gait, posture   Baseline: R shoulder, L iliac crest higher  Goal status: INITIAL  7. Pt will demo increased gait speed > 1.3 m/s in order to ambulate safely in community and return to fitness routine  Baseline: 0.68 m/s   Goal status: INITIAL   Jerl Mina,  PT 06/12/2022, 5:12 PM

## 2022-06-19 ENCOUNTER — Telehealth: Payer: Self-pay | Admitting: Physical Therapy

## 2022-06-19 ENCOUNTER — Ambulatory Visit: Payer: Medicaid Other | Admitting: Physical Therapy

## 2022-06-19 NOTE — Telephone Encounter (Signed)
Physical therapist called pt re: missed appt today.   Left message to confirm next appt and to please call office if need to cancel in advance.    

## 2022-06-27 ENCOUNTER — Ambulatory Visit: Payer: Medicaid Other | Attending: Obstetrics and Gynecology | Admitting: Physical Therapy

## 2022-06-27 DIAGNOSIS — R2689 Other abnormalities of gait and mobility: Secondary | ICD-10-CM | POA: Insufficient documentation

## 2022-06-27 DIAGNOSIS — R278 Other lack of coordination: Secondary | ICD-10-CM | POA: Insufficient documentation

## 2022-06-27 DIAGNOSIS — M533 Sacrococcygeal disorders, not elsewhere classified: Secondary | ICD-10-CM | POA: Insufficient documentation

## 2022-06-27 NOTE — Patient Instructions (Addendum)
Stretch for pelvic floor   V- slides  "v heels slide away and then back toward buttocks and then rock knee to slight ,  slide heel along at 11 o clock away from buttocks   10 reps      childs poses rocking   Toes tucked, shoulders down and back, on forearms , hands shoulder width apart  10 reps    On belly: Riding horse edge of mattress  knee bent like riding a horse, move knee towards armpit and out  10 reps   Mermaid stretch  Rocking while seated on the floor with heels to one side of the hip Heels to one side of the hip  Rock forward towards the knee that is bent , rock beck towards the opposite sitting bones  __   Single heel raises 10 reps x 3 x day   __ Lunge position with vaccumming, less arm/ bending forward,

## 2022-06-27 NOTE — Therapy (Signed)
OUTPATIENT PHYSICAL THERAPY Treatment   Patient Name: Vanessa Vincent MRN: [id number] DOB:05-08-2001, 21 y.o., female Today's Date: 06/27/2022   PT End of Session - 06/27/22 1556     Visit Number 5    Number of Visits 10    Date for PT Re-Evaluation 07/31/22    PT Start Time 1551    PT Stop Time 1630    PT Time Calculation (min) 39 min    Activity Tolerance Patient tolerated treatment well    Behavior During Therapy [facility] for tasks assessed/performed             Past Medical History:  Diagnosis Date   Anxiety with depression    Dehydration 05/09/2015   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[place]) 05/16/2015   Hyponatremia 05/09/2015   Lyme disease    Migraine    Orthodontics    has bracees   Petechiae 05/09/2015   Pneumonia 05/08/2015   Pyelonephritis    Reflux    as infant   Thrombocytopenia ([place])    Past Surgical History:  Procedure Laterality Date   CESAREAN SECTION  09/20/2020   Procedure: CESAREAN SECTION;  Surgeon: Malachy Mood, MD;  Location: [facility] ORS;  Service: Obstetrics;;   COLONOSCOPY WITH PROPOFOL N/A 03/19/2022   Procedure: COLONOSCOPY WITH PROPOFOL;  Surgeon: Jonathon Bellows, MD;  Location: [facility] ENDOSCOPY;  Service: Gastroenterology;  Laterality: N/A;   SEPTOPLASTY N/A 07/23/2016   Procedure: SEPTOPLASTY;  Surgeon: Clyde Canterbury, MD;  Location: [place];  Service: ENT;  Laterality: N/A;   WISDOM TOOTH EXTRACTION     Patient Active Problem List   Diagnosis Date Noted   Chronic constipation    Anxiety and depression 02/07/2020   History of pyelonephritis 05/23/2016    PCP: Sherril Cong, MD   REFERRING PROVIDER:   Wannetta Sender MD   REFERRING DIAG:   K59.00 (ICD-10-CM) - Constipation, unspecified constipation type  M62.838 (ICD-10-CM) - Levator spasm  R39.89 (ICD-10-CM) - Bladder pain    Rationale for Evaluation and Treatment Rehabilitation  THERAPY DIAG:  Sacrococcygeal pain  Other abnormalities of gait and mobility  Other lack of  coordination  ONSET DATE:   SUBJECTIVE:                                                                                                                                                                                           SUBJECTIVE STATEMENT: 1) Urinary Sx: Pt noticed the recurrent UTI -type of Sx but had not UTI. Denied burning with urination. Pt felt discomfort and a heating pad helped relieve it. Pt notices it occurs when she is active and on her feet and doing her chores.  Pt notices she sweats a lot when doing her chores and that is when the UTI -like symptoms returns.      2) Sx: LBP:    No pain across one month   PERTINENT HISTORY:  C-section, dtr is 20 months. Denied fall onto tailbone. Hx of constipation since middle school.   PAIN:  Are you having pain? No  PRECAUTIONS: None  WEIGHT BEARING RESTRICTIONS No  FALLS:  Has patient fallen in last 6 months? No  LIVING ENVIRONMENT: Lives with:  lives with their partner Lives in: House/apartment Stairs: Yes: External: 5 steps; can reach both   OCCUPATION:   stay home mom   PLOF: Independent  PATIENT GOALS   Using bathroom less during the night and not getting the Sx as much    OBJECTIVE:    [facility] PT Assessment - 06/27/22 1602       Strength   Overall Strength Comments PF MMT 4/5  -  16 reps               Pelvic Floor Special Questions - 06/27/22 1629     External Perineal Exam clothing doffed:  tightness along L ischicavernosus/ bulbospongious, transverse perineal at iscial rami L , tightness along posterior SIJ L             [facility] Adult PT Treatment/Exercise - 06/27/22 1630       Neuro Re-ed    Neuro Re-ed Details  cued for pelvic floor stretches      Modalities   Modalities Moist Heat      Moist Heat Therapy   Number Minutes Moist Heat 5 Minutes    Moist Heat Location --   support childs pose to lengthen pelvic floor ( not billed)     Manual Therapy   Manual therapy comments STM/MWM  at problem areas noted in assessment to lengthen pelvic floor on L               HOME EXERCISE PROGRAM: See pt instruction section    ASSESSMENT:  CLINICAL IMPRESSION:  Progressed pt to pelvic floor stretches as pt demo'd increased pelvic floor mm tightness on L . R. Anticipate pt's UTI_like Sx will improve with more pelvic floor Tx. Added pelvic floor stretches to HEP.   LBP has resolved. Pt required PF strengthening and cues for proper body mechanics  with chores to minimize overuse of pelvic floor mm . Plan to add multidifis strengthening next session  Pt benefits from skilled PT.     OBJECTIVE IMPAIRMENTS Abnormal gait, decreased coordination, decreased mobility, decreased ROM, decreased strength, decreased safety awareness, hypomobility, increased fascial restrictions, increased muscle spasms, impaired flexibility, improper body mechanics, postural dysfunction, and pain.   ACTIVITY LIMITATIONS carrying, continence, toileting, and locomotion level  PARTICIPATION LIMITATIONS: interpersonal relationship and occupation  [place] Past/current experiences (C section scar, post partum )  are also affecting patient's functional outcome.   REHAB POTENTIAL: Good  CLINICAL DECISION MAKING: Evolving/moderate complexity  EVALUATION COMPLEXITY: Moderate   PATIENT EDUCATION:   Education details: Showed pt anatomy images. Explained muscles attachments/ connection, physiology of deep core system/ spinal- thoracic-pelvis-lower kinetic chain as they relate to pt's presentation, Sx, and past Hx. Explained what and how these areas of deficits need to be restored to balance and function   See Therapeutic activity / neuromuscular re-education section   Person educated: Patient Education method: Explanation, Demonstration, Tactile cues, Verbal cues, and Handouts Education comprehension: verbalized understanding, returned demonstration, verbal cues required, tactile cues  required, and needs further education   PLAN: PT FREQUENCY: 1x/week  PT DURATION: 10 weeks  PLANNED INTERVENTIONS: Therapeutic exercises, Therapeutic activity, Neuromuscular re-education, Balance training, Gait training, Patient/Family education, Self Care, Joint mobilization, Stair training, Dry Needling, Spinal mobilization, Moist heat, scar mobilization, Taping, and Manual therapy.  PLAN FOR NEXT SESSION: See clinical impression for plan     GOALS: Goals reviewed with patient? Yes  SHORT TERM GOALS: Target date: 07/25/2022  Pt will demo IND with HEP Baseline: Not IND   Goal status: INITIAL    LONG TERM GOALS: Target date: 09/05/2022  Pt will report decreased frequency nocturia to < 2 trips, 1 x within every 2 hours during the day  Baseline: nightime 3-5, daytime 2-3 within 2 hours Goal status: INITIAL  2.  Pt will demo decreased C section scar restrictions in order to progress deep core strengthening   Baseline:  restricted  Goal status: INITIAL  3.  Pt will demo proper deep core coordination without chest breathing and optimal excursion of diaphragm/pelvic floor in order to promote spinal stability and pelvic floor function   Baseline: chest breathing  Goal status: INITIAL  4.  Pt will demo > 5 pt change on FOTO  to improve QOL and function PFDI urinary /pelvic pain  baseline  42 pts Urinary Problem  63 pts   Goal status: INITIAL  5.  Pt will demo > 5 pt change on FOTO Lumbar  to improve QOL and function   Baseline: 47 pts Goal status: INITIAL  6.  Pt will demo levelled pelvic girdle and shoulder height in order to progress to deep core strengthening HEP and restore mobility at spine, pelvis, gait, posture   Baseline: R shoulder, L iliac crest higher  Goal status: INITIAL  7. Pt will demo increased gait speed > 1.3 m/s in order to ambulate safely in community and return to fitness routine  Baseline: 0.68 m/s   Goal status: INITIAL   Jerl Mina,  PT 06/27/2022, 4:06 PM

## 2022-07-03 ENCOUNTER — Ambulatory Visit: Payer: Medicaid Other | Admitting: Physical Therapy

## 2022-07-03 DIAGNOSIS — R278 Other lack of coordination: Secondary | ICD-10-CM

## 2022-07-03 DIAGNOSIS — M533 Sacrococcygeal disorders, not elsewhere classified: Secondary | ICD-10-CM | POA: Diagnosis not present

## 2022-07-03 DIAGNOSIS — R2689 Other abnormalities of gait and mobility: Secondary | ICD-10-CM

## 2022-07-03 NOTE — Therapy (Signed)
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[place]) 05/16/2015   Hyponatremia 05/09/2015   Lyme disease    Migraine    Orthodontics    has bracees   Petechiae 05/09/2015   Pneumonia 05/08/2015   Pyelonephritis    Reflux    as infant   Thrombocytopenia ([place])    Past Surgical History:  Procedure Laterality Date   CESAREAN SECTION  09/20/2020   Procedure: CESAREAN SECTION;  Surgeon: Malachy Mood, MD;  Location: [facility] ORS;  Service: Obstetrics;;   COLONOSCOPY WITH PROPOFOL N/A 03/19/2022   Procedure: COLONOSCOPY WITH PROPOFOL;  Surgeon: Jonathon Bellows, MD;  Location: [facility] ENDOSCOPY;  Service: Gastroenterology;  Laterality: N/A;   SEPTOPLASTY N/A 07/23/2016   Procedure: SEPTOPLASTY;  Surgeon: Clyde Canterbury, MD;  Location: [place];  Service: ENT;  Laterality: N/A;   WISDOM TOOTH EXTRACTION     Patient Active Problem List   Diagnosis Date Noted   Chronic constipation    Anxiety and depression 02/07/2020   History of pyelonephritis 05/23/2016    PCP: Sherril Cong, MD   REFERRING PROVIDER:   Wannetta Sender MD   REFERRING DIAG:   K59.00 (ICD-10-CM) - Constipation, unspecified constipation type  M62.838 (ICD-10-CM) - Levator spasm  R39.89 (ICD-10-CM) - Bladder pain    Rationale for Evaluation and Treatment Rehabilitation  THERAPY DIAG:  Sacrococcygeal pain  Other abnormalities of gait and mobility  Other lack of  coordination  ONSET DATE:   SUBJECTIVE:                                                                                                                                                                                           SUBJECTIVE STATEMENT: 1) Urinary Sx: Pt has not had her UTI symptom since last week    2) Sx: LBP:   Pt noticed some pain in the  B low back but it did not radiate pain. It lasted a couple of hours. She was  on her feet a lot yesterday.   PERTINENT HISTORY:  C-section, dtr is 20 months. Denied fall onto tailbone. Hx of constipation since middle school.   PAIN:  Are you having pain? No  PRECAUTIONS: None  WEIGHT BEARING RESTRICTIONS No  FALLS:  Has patient fallen in last 6 months? No  LIVING ENVIRONMENT: Lives with:  lives with their partner Lives in: House/apartment Stairs: Yes: External: 5 steps; can reach both   OCCUPATION:   stay home mom   PLOF: Independent  PATIENT GOALS   Using bathroom less during the night and not getting the Sx as much    OBJECTIVE:       Pelvic Floor Special Questions - 07/03/22 1744     Pelvic Floor Internal Exam pt consented verbally and had no contraindications    Exam Type Vaginal    Palpation tenderness/ tightness at 4-6 clock, obt int L , chest breathing/             [facility] Adult PT Treatment/Exercise - 07/03/22 1744       Neuro Re-ed    Neuro Re-ed Details  cued for pelvic tilts and less hyperextended knees      Modalities   Modalities Moist Heat      Moist Heat Therapy   Moist Heat Location --   support childs pose to lengthen pelvic floor ( not billed)     Manual Therapy   Internal Pelvic Floor STM/MWM at problem areas noted in assessment to lengthen pelvic floor                     HOME EXERCISE PROGRAM: See pt instruction section    ASSESSMENT:  CLINICAL IMPRESSION:   UTI_like Sx resolved compared to last session.  Progressed to internal pelvic floor assessment which  showed tightness at perineum and L pelvic floor. Pt demo'd slightly lowered bladder on L. Pt demo'd imrpoved cranial position of bladder and less tightness/ tenderness at L pelvic floor.    Plan to add multidifis strengthening next session  Pt benefits from skilled PT.     OBJECTIVE IMPAIRMENTS Abnormal gait, decreased coordination, decreased mobility, decreased ROM, decreased strength, decreased safety awareness, hypomobility, increased fascial restrictions, increased muscle spasms, impaired flexibility, improper body mechanics, postural dysfunction, and pain.   ACTIVITY LIMITATIONS carrying, continence, toileting, and locomotion level  PARTICIPATION LIMITATIONS: interpersonal relationship and occupation  [place] Past/current experiences (C section scar, post partum )  are also affecting patient's functional outcome.   REHAB POTENTIAL: Good  CLINICAL DECISION MAKING: Evolving/moderate complexity  EVALUATION COMPLEXITY: Moderate   PATIENT EDUCATION:   Education details: Showed pt anatomy images. Explained muscles attachments/ connection, physiology of deep core system/ spinal- thoracic-pelvis-lower kinetic chain as they relate to pt's presentation, Sx, and past Hx. Explained what and how these areas of deficits need to be restored to balance and function   See Therapeutic activity / neuromuscular re-education section   Person educated: Patient Education method: Explanation, Demonstration, Tactile cues, Verbal cues, and Handouts Education comprehension: verbalized understanding, returned demonstration, verbal cues required, tactile cues required, and needs further education   PLAN: PT FREQUENCY: 1x/week  PT DURATION: 10 weeks  PLANNED INTERVENTIONS: Therapeutic exercises, Therapeutic activity, Neuromuscular re-education, Balance training, Gait training, Patient/Family education, Self Care, Joint mobilization, Stair training, Dry Needling, Spinal mobilization, Moist  heat, scar mobilization, Taping, and Manual therapy.  PLAN FOR NEXT SESSION: See clinical impression for plan     GOALS: Goals reviewed with patient? Yes  SHORT  TERM GOALS: Target date: 07/31/2022  Pt will demo IND with HEP Baseline: Not IND   Goal status: INITIAL    LONG TERM GOALS: Target date: 09/11/2022  Pt will report decreased frequency nocturia to < 2 trips, 1 x within every 2 hours during the day  Baseline: nightime 3-5, daytime 2-3 within 2 hours Goal status: INITIAL  2.  Pt will demo decreased C section scar restrictions in order to progress deep core strengthening   Baseline:  restricted  Goal status: INITIAL  3.  Pt will demo proper deep core coordination without chest breathing and optimal excursion of diaphragm/pelvic floor in order to promote spinal stability and pelvic floor function   Baseline: chest breathing  Goal status: INITIAL  4.  Pt will demo > 5 pt change on FOTO  to improve QOL and function PFDI urinary /pelvic pain  baseline  42 pts Urinary Problem  63 pts   Goal status: INITIAL  5.  Pt will demo > 5 pt change on FOTO Lumbar  to improve QOL and function   Baseline: 47 pts Goal status: INITIAL  6.  Pt will demo levelled pelvic girdle and shoulder height in order to progress to deep core strengthening HEP and restore mobility at spine, pelvis, gait, posture   Baseline: R shoulder, L iliac crest higher  Goal status: INITIAL  7. Pt will demo increased gait speed > 1.3 m/s in order to ambulate safely in community and return to fitness routine  Baseline: 0.68 m/s   Goal status: INITIAL   Jerl Mina, PT 07/03/2022, 4:42 PM

## 2022-07-10 ENCOUNTER — Telehealth: Payer: Self-pay | Admitting: Physical Therapy

## 2022-07-10 ENCOUNTER — Ambulatory Visit: Payer: Medicaid Other | Admitting: Physical Therapy

## 2022-07-10 NOTE — Telephone Encounter (Signed)
Physical therapist called pt re: missed appt today. Provided encouragement to keep up with HEP and confirm next appt. Offered opening tomorrow at 4:30 if pt wished to make up today's session, otherwise will see pt next week.

## 2022-07-17 ENCOUNTER — Ambulatory Visit: Payer: Medicaid Other | Admitting: Physical Therapy

## 2022-07-24 ENCOUNTER — Ambulatory Visit: Payer: Medicaid Other | Attending: Obstetrics and Gynecology | Admitting: Physical Therapy

## 2022-07-24 DIAGNOSIS — R2689 Other abnormalities of gait and mobility: Secondary | ICD-10-CM | POA: Diagnosis present

## 2022-07-24 DIAGNOSIS — M533 Sacrococcygeal disorders, not elsewhere classified: Secondary | ICD-10-CM

## 2022-07-24 DIAGNOSIS — R278 Other lack of coordination: Secondary | ICD-10-CM | POA: Diagnosis present

## 2022-07-24 NOTE — Therapy (Signed)
OUTPATIENT PHYSICAL THERAPY Treatment   Patient Name: Vanessa Vincent MRN: [id number] DOB:[birth year], 21 y.o., female Today's Date: 07/24/2022   PT End of Session - 07/24/22 1602     Visit Number 7    Number of Visits 10    Date for PT Re-Evaluation 07/31/22    PT Start Time 1600    PT Stop Time 1630    PT Time Calculation (min) 30 min    Activity Tolerance Patient tolerated treatment well    Behavior During Therapy [facility] for tasks assessed/performed             Past Medical History:  Diagnosis Date   Anxiety with depression    Dehydration 05/09/2015   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[place]) 05/16/2015   Hyponatremia 05/09/2015   Lyme disease    Migraine    Orthodontics    has bracees   Petechiae 05/09/2015   Pneumonia 05/08/2015   Pyelonephritis    Reflux    as infant   Thrombocytopenia ([place])    Past Surgical History:  Procedure Laterality Date   CESAREAN SECTION  09/20/2020   Procedure: CESAREAN SECTION;  Surgeon: Malachy Mood, MD;  Location: [facility] ORS;  Service: Obstetrics;;   COLONOSCOPY WITH PROPOFOL N/A 03/19/2022   Procedure: COLONOSCOPY WITH PROPOFOL;  Surgeon: Jonathon Bellows, MD;  Location: [facility] ENDOSCOPY;  Service: Gastroenterology;  Laterality: N/A;   SEPTOPLASTY N/A 07/23/2016   Procedure: SEPTOPLASTY;  Surgeon: Clyde Canterbury, MD;  Location: [place];  Service: ENT;  Laterality: N/A;   WISDOM TOOTH EXTRACTION     Patient Active Problem List   Diagnosis Date Noted   Chronic constipation    Anxiety and depression 02/07/2020   History of pyelonephritis 05/23/2016    PCP: Sherril Cong, MD   REFERRING PROVIDER:   Wannetta Sender MD   REFERRING DIAG:   K59.00 (ICD-10-CM) - Constipation, unspecified constipation type  M62.838 (ICD-10-CM) - Levator spasm  R39.89 (ICD-10-CM) - Bladder pain    Rationale for Evaluation and Treatment Rehabilitation  THERAPY DIAG:  Sacrococcygeal pain  Other abnormalities of gait and mobility  Other lack of  coordination  ONSET DATE:   SUBJECTIVE:                                                                                                                                                                                           SUBJECTIVE STATEMENT: 1) Urinary Sx:  pt experienced 1-2 x the UTI -like Sx across the past 2 weeks    2) Sx: LBP:   only with  menstrual cycle. Pt is on her period right now.    PERTINENT HISTORY:  C-section, dtr  is 20 months. Denied fall onto tailbone. Hx of constipation since middle school.   PAIN:  Are you having pain? No  PRECAUTIONS: None  WEIGHT BEARING RESTRICTIONS No  FALLS:  Has patient fallen in last 6 months? No  LIVING ENVIRONMENT: Lives with:  lives with their partner Lives in: House/apartment Stairs: Yes: External: 5 steps; can reach both   OCCUPATION:   stay home mom   PLOF: Independent  PATIENT GOALS   Using bathroom less during the night and not getting the Sx as much    OBJECTIVE:       [facility] PT Assessment - 07/26/22 1211       AROM   Overall AROM Comments figure-4 : 60 cm L, 61 cm R from lateral tib plat to floor      Palpation   Palpation comment tightness along L anterolateral leg, glut attachments on ischial tuberosity/ rami L             [facility] Adult PT Treatment/Exercise - 07/26/22 1211       Ambulation/Gait   Gait Comments supination at stance phase, minimal trunk rotation, short stride, minimal hip flexion. knee flexion      Neuro Re-ed    Neuro Re-ed Details  cued for more DF/EV, toe abduction, less supination in walking and added two CKC lower kinetic chain chain to minimize pelvic floor tightness/ adductoed knees/ supination, gait training      Manual Therapy   Internal Pelvic Floor STM/MWM at problem areas noted in assessment to address lower kinetic chain                HOME EXERCISE PROGRAM: See pt instruction section    ASSESSMENT:  CLINICAL IMPRESSION:    Pt experienced 1-2 x  the UTI -like Sx across the past 2 weeks  Deferred internal pelvic floor assessment this week as pt was on her menstrual cycle.   Manual Tx was provided to minimize tightness along L lower kinetic chain which is likely related to her pelvic floor overactivity.   Plan to continue with minimize hypomobility of L foot and address lower kinetic chain, add multidifis strengthening next session. Resume pelvic floor reassessment and Tx after lower kinetic chain deficits have improved.   Pt benefits from skilled PT.     OBJECTIVE IMPAIRMENTS Abnormal gait, decreased coordination, decreased mobility, decreased ROM, decreased strength, decreased safety awareness, hypomobility, increased fascial restrictions, increased muscle spasms, impaired flexibility, improper body mechanics, postural dysfunction, and pain.   ACTIVITY LIMITATIONS carrying, continence, toileting, and locomotion level  PARTICIPATION LIMITATIONS: interpersonal relationship and occupation  [place] Past/current experiences (C section scar, post partum )  are also affecting patient's functional outcome.   REHAB POTENTIAL: Good  CLINICAL DECISION MAKING: Evolving/moderate complexity  EVALUATION COMPLEXITY: Moderate   PATIENT EDUCATION:   Education details: Showed pt anatomy images. Explained muscles attachments/ connection, physiology of deep core system/ spinal- thoracic-pelvis-lower kinetic chain as they relate to pt's presentation, Sx, and past Hx. Explained what and how these areas of deficits need to be restored to balance and function   See Therapeutic activity / neuromuscular re-education section   Person educated: Patient Education method: Explanation, Demonstration, Tactile cues, Verbal cues, and Handouts Education comprehension: verbalized understanding, returned demonstration, verbal cues required, tactile cues required, and needs further education   PLAN: PT FREQUENCY: 1x/week  PT DURATION: 10  weeks  PLANNED INTERVENTIONS: Therapeutic exercises, Therapeutic activity, Neuromuscular re-education, Balance training, Gait training, Patient/Family education, Self Care, Joint mobilization, Stair training,  Dry Needling, Spinal mobilization, Moist heat, scar mobilization, Taping, and Manual therapy.  PLAN FOR NEXT SESSION: See clinical impression for plan     GOALS: Goals reviewed with patient? Yes  SHORT TERM GOALS: Target date: 08/21/2022  Pt will demo IND with HEP Baseline: Not IND   Goal status: INITIAL    LONG TERM GOALS: Target date: 10/02/2022  Pt will report decreased frequency nocturia to < 2 trips, 1 x within every 2 hours during the day  Baseline: nightime 3-5, daytime 2-3 within 2 hours Goal status: INITIAL  2.  Pt will demo decreased C section scar restrictions in order to progress deep core strengthening   Baseline:  restricted  Goal status: INITIAL  3.  Pt will demo proper deep core coordination without chest breathing and optimal excursion of diaphragm/pelvic floor in order to promote spinal stability and pelvic floor function   Baseline: chest breathing  Goal status: INITIAL  4.  Pt will demo > 5 pt change on FOTO  to improve QOL and function PFDI urinary /pelvic pain  baseline  42 pts Urinary Problem  63 pts   Goal status: INITIAL  5.  Pt will demo > 5 pt change on FOTO Lumbar  to improve QOL and function   Baseline: 47 pts Goal status: INITIAL  6.  Pt will demo levelled pelvic girdle and shoulder height in order to progress to deep core strengthening HEP and restore mobility at spine, pelvis, gait, posture   Baseline: R shoulder, L iliac crest higher  Goal status: INITIAL  7. Pt will demo increased gait speed > 1.3 m/s in order to ambulate safely in community and return to fitness routine  Baseline: 0.68 m/s   Goal status: INITIAL   Jerl Mina, PT 07/24/2022, 4:02 PM

## 2022-07-24 NOTE — Patient Instructions (Addendum)
Strengthening feet arches:    Heel raises - heels together, minisquat  Minisquat motion, trunk bent , gaze onto floor like you are looking at your reflection over a lake/pond,  Knees bent pointed out like a "v" , navel ( center of mass) more forward  Heels together as you lift, pointed out like a "v"  KNEES ARE ALIGNED BEHIND THE TOES TO [place] your  navel ( center of mass) more forward to a avoid dropping down fast and rocking more weight back onto heels , keep heels pressing against each other the whole time   10 reps      _________   Feet slides :   Points of contact at sitting bones  Four points of contact of foot,  Side knee back while keeping knee out along 2-3rd toe line   Heel up, ankle not twist out Lower heel while keeping knee out along 2-3rd toe line Four points of contact of foot, Slide foot back while keeping knee out along 2-3rd toe line   Repeated with other foot    __  Walk with higher knees

## 2022-10-16 ENCOUNTER — Ambulatory Visit: Payer: Medicaid Other | Admitting: Obstetrics and Gynecology

## 2022-10-16 NOTE — Progress Notes (Deleted)
 Urogynecology Return Visit  SUBJECTIVE  History of Present Illness: Vanessa Vincent is a 21 y.o. female seen in follow-up for bladder pain and urinary urgency.   She has the physical therapy scheduled to start in July. She is just taking the cimetidine as needed when she starts to get bladder pain. This week has noticed some pain 2-3 times which is not typical for her. Has only been drinking water and unsweet tea.   At night she is getting up 3-4 times to urinate. She does drink some water before bed but not a lot.   Had colonoscopy with GI referral. She was started on Linzess and that has been helping with her constipation.   Past Medical History: Patient  has a past medical history of Anxiety with depression, Dehydration (05/09/2015), 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[place]) (05/16/2015), Hyponatremia (05/09/2015), Lyme disease, Migraine, Orthodontics, Petechiae (05/09/2015), Pneumonia (05/08/2015), Pyelonephritis, Reflux, and Thrombocytopenia ([place]).   Past Surgical History: She  has a past surgical history that includes Septoplasty (N/A, 07/23/2016); Cesarean section (09/20/2020); Wisdom tooth extraction; and Colonoscopy with propofol (N/A, 03/19/2022).   Medications: She has a current medication list which includes the following prescription(s): linaclotide.   Allergies: Patient has No Known Allergies.   Social History: Patient  reports that she has never smoked. She has never used smokeless tobacco. She reports that she does not currently use drugs after having used the following drugs: Marijuana. She reports that she does not drink alcohol.      OBJECTIVE     Physical Exam: There were no vitals filed for this visit.  Gen: No apparent distress, A&O x 3.  Detailed Urogynecologic Evaluation:  Deferred.    ASSESSMENT AND PLAN    Vanessa Vincent is a 21 y.o. with:  No diagnosis found.  - Continue cimetidine - Discussed reducing tea intake since this can be a  bladder irritant.  - Stop drinking 2 hours before bed, work on bladder retraining with PT (starting July).   Follow up 6 months or sooner if needed  Jaquita Folds, MD  Time spent: I spent 20 minutes dedicated to the care of this patient on the date of this encounter to include pre-visit review of records, face-to-face time with the patient and post visit documentation.

## 2023-06-25 ENCOUNTER — Ambulatory Visit (INDEPENDENT_AMBULATORY_CARE_PROVIDER_SITE_OTHER): Payer: Medicaid Other | Admitting: Urology

## 2023-06-25 VITALS — BP 125/85 | HR 87 | Ht 65.0 in | Wt 180.0 lb

## 2023-06-25 DIAGNOSIS — R350 Frequency of micturition: Secondary | ICD-10-CM | POA: Diagnosis not present

## 2023-06-25 DIAGNOSIS — N3010 Interstitial cystitis (chronic) without hematuria: Secondary | ICD-10-CM | POA: Diagnosis not present

## 2023-06-25 LAB — BLADDER SCAN AMB NON-IMAGING: Scan Result: 0

## 2023-06-25 NOTE — Progress Notes (Signed)
Marcelle Overlie Plume,acting as a scribe for Vanna Scotland, MD.,have documented all relevant documentation on the behalf of Vanna Scotland, MD,as directed by  Vanna Scotland, MD while in the presence of Vanna Scotland, MD.  06/25/2023 4:13 PM   Vanessa Vincent [birth year] [id number]  Referring provider: Leanna Sato, MD [address] [place],  [place] [address]  Chief Complaint  Patient presents with   Follow-up    HPI: 22 year-old female who presents today for further evaluation of bladder pain syndrome.   She an established patient with Dr. Florian Buff of Urogynecology who has been managing this condition. She has had an extensive evaluation. She was known to have levator tenderness and UTI-type symptoms without documented infections. She was diagnosed with bladder pain syndrome/ IC and was provided dietary information and prescribed cimetidine. She was advised to reduce bladder irritants, including tea. The patient was last seen a year ago and did not follow up. She completed pelvic floor physical therapy for urinary symptoms and has had unremarkable pelvic exams. Despite having an established relationship with Urogynecology, her PCP referred her to urology. She has had one documented infection over a year ago, which grew a significant amount of bacteria.   Her recent urinalysis is negative except for >10 epithelial cells. Her PVR is zero.   She reports experiencing a constant urge to urinate without burning, which disrupts her sleep and daily activities. She finds relief using a heating pad or taking a hot shower. She initially found cimetidine effective but noted a decrease in its efficacy over time. She has also tried over-the-counter Azo, which provided some relief. She mentions seeing white debris in her urine, which she initially thought might indicate a yeast infection, but she has no other symptoms of infection.   Results for orders placed or performed in visit on 06/25/23   BLADDER SCAN AMB NON-IMAGING  Result Value Ref Range   Scan Result 0 ml      PMH: Past Medical History:  Diagnosis Date   Anxiety with depression    Dehydration 05/09/2015   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HCC) 05/16/2015   Hyponatremia 05/09/2015   Lyme disease    Migraine    Orthodontics    has bracees   Petechiae 05/09/2015   Pneumonia 05/08/2015   Pyelonephritis    Reflux    as infant   Thrombocytopenia (HCC)     Surgical History: Past Surgical History:  Procedure Laterality Date   CESAREAN SECTION  09/20/2020   Procedure: CESAREAN SECTION;  Surgeon: Vena Austria, MD;  Location: [facility] ORS;  Vincent: Obstetrics;;   COLONOSCOPY WITH PROPOFOL N/A 03/19/2022   Procedure: COLONOSCOPY WITH PROPOFOL;  Surgeon: Wyline Mood, MD;  Location: [facility];  Vincent: Gastroenterology;  Laterality: N/A;   SEPTOPLASTY N/A 07/23/2016   Procedure: SEPTOPLASTY;  Surgeon: Geanie Logan, MD;  Location: [facility];  Vincent: ENT;  Laterality: N/A;   WISDOM TOOTH EXTRACTION      Home Medications:  Allergies as of 06/25/2023   No Known Allergies      Medication List        Accurate as of June 25, 2023  4:13 PM. If you have any questions, ask your nurse or doctor.          linaclotide 290 MCG Caps capsule Commonly known as: Linzess Take 1 capsule (290 mcg total) by mouth daily.   Wegovy 0.25 MG/0.5ML Soaj Generic drug: Semaglutide-Weight Management        Family History: Family History  Problem Relation Age of Onset   Juvenile Rhematoid Arthritis Sister    Lupus Mother    Fibromyalgia Mother    Hypertension Mother    Breast cancer Maternal Grandmother    Diabetes Sister    Heart disease Maternal Aunt    Cancer Maternal Aunt     Social History:  reports that she has never smoked. She has never used smokeless tobacco. She reports that she does not currently use drugs after having used the following drugs: Marijuana. She reports that she does not  drink alcohol.   Physical Exam: BP 125/85   Pulse 87   Ht 5\' 5"  (1.651 m)   Wt 180 lb (81.6 kg)   BMI 29.95 kg/m   Constitutional:  Alert and oriented, No acute distress. HEENT: [place] AT, moist mucus membranes.  Trachea midline, no masses. Neurologic: Grossly intact, no focal deficits, moving all 4 extremities. Psychiatric: Normal mood and affect.  Ua today is negative other than squam  Assessment & Plan:    1. Interstitial cystitis - We discussed the diease pathophysiology is poorly understood therefore treatment has been focused primarily on symptomatic relief as well as dietary and behavioral modification. Information pamphlets were reviewed and given today discussing the current understanding of the syndrome as well as treatment options. IC dietary information also given today as many patients experience relief with simple lifestyle modifications. We also discussed intermittent use of over the counter pyridium (no greater than 3 days at at time) for intermittent relief.  If conservative management fails, will consider further work up with cystoscopy to access for Hunter's ulcers or other more aggressive treatments, however, response to each of these interventions is highly variable.  - Continue to avoid bladder irritants, especially tea. - Reintroduce over-the-counter Azo for bladder pain relief - Consider trying a different antihistamine, such as hydroxyzine, if Azo is not effective. - Encourage increased water intake. - Provided her with educational materials and dietary handouts related to IC. - Follow up with Dr. Florian Buff for continuity of care and further management.  I have reviewed the above documentation for accuracy and completeness, and I agree with the above.   Vanna Scotland, MD  [facility] [address] [place], [place] [address] [phone]

## 2023-06-25 NOTE — Patient Instructions (Signed)
[email or web address]://www.ichelp.org/

## 2023-06-26 LAB — URINALYSIS, COMPLETE
Bilirubin, UA: NEGATIVE
Glucose, UA: NEGATIVE
Ketones, UA: NEGATIVE
Leukocytes,UA: NEGATIVE
Nitrite, UA: NEGATIVE
Protein,UA: NEGATIVE
RBC, UA: NEGATIVE
Specific Gravity, UA: 1.02 (ref 1.005–1.030)
Urobilinogen, Ur: 1 mg/dL (ref 0.2–1.0)
pH, UA: 6.5 (ref 5.0–7.5)

## 2023-06-26 LAB — MICROSCOPIC EXAMINATION: Epithelial Cells (non renal): >10 /HPF — AB (ref 0–10)

## 2023-07-07 ENCOUNTER — Telehealth: Payer: Medicaid Other | Admitting: Physician Assistant

## 2023-07-07 DIAGNOSIS — R3989 Other symptoms and signs involving the genitourinary system: Secondary | ICD-10-CM | POA: Diagnosis not present

## 2023-07-07 MED ORDER — CEPHALEXIN 500 MG PO CAPS: 500.0000 mg | ORAL_CAPSULE | Freq: Two times a day (BID) | ORAL | 0 refills | Status: DC

## 2023-07-07 MED ORDER — PHENAZOPYRIDINE HCL 200 MG PO TABS: 200.0000 mg | ORAL_TABLET | Freq: Three times a day (TID) | ORAL | 0 refills | Status: DC | PRN

## 2023-07-07 NOTE — Progress Notes (Signed)
E-Visit for Urinary Problems  We are sorry that you are not feeling well.  Here is how we plan to help!  Based on what you shared with me it looks like you most likely have a simple urinary tract infection.  A UTI (Urinary Tract Infection) is a bacterial infection of the bladder.  Most cases of urinary tract infections are simple to treat but a key part of your care is to encourage you to drink plenty of fluids and watch your symptoms carefully.  I have prescribed Keflex 500 mg twice a day for 7 days.  I have also added Pyridium 200mg  Take 1 tablet every 8 hours for bladder pain/spasms. This will turn your urine red/orange color. Your symptoms should gradually improve. Call us if the burning in your urine worsens, you develop worsening fever, back pain or pelvic pain or if your symptoms do not resolve after completing the antibiotic.  Urinary tract infections can be prevented by drinking plenty of water to keep your body hydrated.  Also be sure when you wipe, wipe from front to back and don't hold it in!  If possible, empty your bladder every 4 hours.  HOME CARE Drink plenty of fluids Compete the full course of the antibiotics even if the symptoms resolve Remember, when you need to go.go. Holding in your urine can increase the likelihood of getting a UTI! GET HELP RIGHT AWAY IF: You cannot urinate You get a high fever Worsening back pain occurs You see blood in your urine You feel sick to your stomach or throw up You feel like you are going to pass out  MAKE SURE YOU  Understand these instructions. Will watch your condition. Will get help right away if you are not doing well or get worse.   Thank you for choosing an e-visit.  Your e-visit answers were reviewed by a board certified advanced clinical practitioner to complete your personal care plan. Depending upon the condition, your plan could have included both over the counter or prescription medications.  Please review your  pharmacy choice. Make sure the pharmacy is open so you can pick up prescription now. If there is a problem, you may contact your provider through [employer] and have the prescription routed to another pharmacy.  Your safety is important to [place]. If you have drug allergies check your prescription carefully.   For the next 24 hours you can use MyChart to ask questions about today's visit, request a non-urgent call back, or ask for a work or school excuse. You will get an email in the next two days asking about your experience. I hope that your e-visit has been valuable and will speed your recovery.  I have spent 5 minutes in review of e-visit questionnaire, review and updating patient chart, medical decision making and response to patient.   Margaretann Loveless, PA-C

## 2023-10-02 ENCOUNTER — Other Ambulatory Visit: Payer: Self-pay | Admitting: Medical Genetics

## 2023-10-02 ENCOUNTER — Encounter (INDEPENDENT_AMBULATORY_CARE_PROVIDER_SITE_OTHER): Payer: Self-pay

## 2023-10-03 ENCOUNTER — Other Ambulatory Visit
Admission: RE | Admit: 2023-10-03 | Discharge: 2023-10-03 | Disposition: A | Discharge status: Home / Self Care | Payer: Self-pay | Source: Ambulatory Visit | Attending: Medical Genetics | Admitting: Medical Genetics

## 2023-10-13 ENCOUNTER — Ambulatory Visit (INDEPENDENT_AMBULATORY_CARE_PROVIDER_SITE_OTHER): Payer: Medicaid Other | Admitting: Licensed Practical Nurse

## 2023-10-13 VITALS — BP 132/82 | HR 69 | Ht 65.0 in | Wt 190.3 lb

## 2023-10-13 DIAGNOSIS — Z6831 Body mass index (BMI) 31.0-31.9, adult: Secondary | ICD-10-CM | POA: Diagnosis not present

## 2023-10-13 DIAGNOSIS — N926 Irregular menstruation, unspecified: Secondary | ICD-10-CM

## 2023-10-13 NOTE — Progress Notes (Signed)
HPI:      [name] is a 22 y.o. G1P1001 who LMP was Patient's last menstrual period was 10/13/2023 (approximate)., presents today for a problem visit.  She complains irregular menstrual cycles for the last 4 months. Her cycles used to be monthly. Now they are 5-8 days late. This most recent cycle was 19 days late. Her period lasts for 5 days, the first few days are heaviest where she needs to change her tampon every time she uses the BR which is about every 1/5-2 hours. She does pass blood clots. She does cramp, but not all of the time. When her cycles are late she does not have PMS symptoms. She is not on any hormones. She is not on any new medications. Reports mood is good. Denies any recent stress. She did see her provider at [facility], they did a PCOS work up , she does not have PCOS. She was also screened for STI's-which were negative. She is here today because she wants to know why her cycles are irregular.   She has used OPK for the last 2 cycles. The first cycle she did not ovulate, the second cycle she did ovulate.   She is sexually active with one female, she is not using contraception, is open to a pregnancy if where to happen.   She has been trying to lose weight, she went regularly to the gym for 6 months and did not see any results, she was then started on Wegovy but was not on it long enough to see any change-the dose she needs is not available. She has made some changes to her diet but admits she has problems with portions.    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 Also,  has a past surgical history that includes Septoplasty (N/A, 07/23/2016); Cesarean section (09/20/2020); Wisdom tooth extraction; and  Colonoscopy with propofol (N/A, 03/19/2022)., family history includes Breast cancer in her maternal grandmother; Cancer in her maternal aunt; Diabetes in her sister; Fibromyalgia in her mother; Heart disease in her maternal aunt; Hypertension in her mother; Juvenile Rhematoid Arthritis in her sister; Lupus in her mother.,  reports that she has never smoked. She has never used smokeless tobacco. She reports that she does not currently use drugs after having used the following drugs: Marijuana. She reports that she does not drink alcohol.  She  Current Outpatient Medications:    linaclotide (LINZESS) 290 MCG CAPS capsule, Take 1 capsule (290 mcg total) by mouth daily., Disp: 90 capsule, Rfl: 3  Also, has no known allergies.  ROS see HPI   Objective: BP 132/82 (BP Location: Left Arm, Patient Position: Sitting, Cuff Size: Normal)   Pulse 69   Ht 5\' 5"  (1.651 m)   Wt 190 lb 4.8 oz (86.3 kg)   LMP 10/13/2023 (Approximate)   BMI 31.67 kg/m  Physical Exam Constitutional:      Appearance: Normal appearance.  Pulmonary:     Effort: Pulmonary effort is normal.  Musculoskeletal:     Cervical back: Normal range of motion.  Neurological:     Mental Status: She is alert.  Psychiatric:        Mood and Affect: Mood normal.     ASSESSMENT/PLAN:  irregular menstrual cycle   Problem List Items Addressed This Visit   None Visit Diagnoses  Irregular periods/menstrual cycles    -  Primary   Relevant Orders   Prolactin   Hemoglobin A1c   FSH/LH   Progesterone     BMI 31.0-31.9,adult       Relevant Orders   Ambulatory referral to [facility]      Discussed reason for irregular cycles most likely due to anovulatory cycles, this could be d/t carrying excessive weight. Referral to Dr Dalbert Garnet made for weight loss. Continue to track cycles with OPK.   Will be in contact with pt once labs result   Carie Caddy, Ina Homes  [facility]  10/13/23  4:51 PM

## 2023-10-20 LAB — GENECONNECT MOLECULAR SCREEN: Genetic Analysis Overall Interpretation: NEGATIVE

## 2023-10-21 ENCOUNTER — Encounter (INDEPENDENT_AMBULATORY_CARE_PROVIDER_SITE_OTHER): Payer: Medicaid Other | Admitting: Physician Assistant

## 2023-11-07 ENCOUNTER — Other Ambulatory Visit: Payer: Medicaid Other

## 2023-11-07 DIAGNOSIS — N926 Irregular menstruation, unspecified: Secondary | ICD-10-CM

## 2023-11-08 LAB — FSH/LH
FSH: 2.7 m[IU]/mL
LH: 11.1 m[IU]/mL

## 2023-11-08 LAB — PROLACTIN: Prolactin: 18.4 ng/mL (ref 4.8–33.4)

## 2023-11-08 LAB — PROGESTERONE: Progesterone: 10.5 ng/mL

## 2023-11-08 LAB — HEMOGLOBIN A1C
Est. average glucose Bld gHb Est-mCnc: 103 mg/dL
Hgb A1c MFr Bld: 5.2 % (ref 4.8–5.6)

## 2023-11-12 ENCOUNTER — Encounter (INDEPENDENT_AMBULATORY_CARE_PROVIDER_SITE_OTHER): Payer: Self-pay | Admitting: Family Medicine

## 2023-11-12 ENCOUNTER — Ambulatory Visit (INDEPENDENT_AMBULATORY_CARE_PROVIDER_SITE_OTHER): Payer: Medicaid Other | Admitting: Family Medicine

## 2023-11-12 VITALS — BP 104/69 | HR 88 | Temp 99.1°F | Ht 64.0 in | Wt 189.0 lb

## 2023-11-12 DIAGNOSIS — Z6832 Body mass index (BMI) 32.0-32.9, adult: Secondary | ICD-10-CM

## 2023-11-12 DIAGNOSIS — K5900 Constipation, unspecified: Secondary | ICD-10-CM

## 2023-11-12 DIAGNOSIS — Z0289 Encounter for other administrative examinations: Secondary | ICD-10-CM

## 2023-11-12 DIAGNOSIS — E66811 Obesity, class 1: Secondary | ICD-10-CM

## 2023-11-12 DIAGNOSIS — E669 Obesity, unspecified: Secondary | ICD-10-CM | POA: Diagnosis not present

## 2023-11-12 NOTE — Progress Notes (Signed)
[name], DO, ABFM, ABOM Bariatric physician [address] [place], [place], [place] [address] Office: [phone]  /  Fax: [phone]     Initial Evaluation:  Vanessa Vincent was seen in clinic today to evaluate for obesity. She is interested in losing weight to improve overall health and reduce the risk of weight related complications. She presents today to review program treatment options, initial physical assessment, and evaluation.      She was referred by: Specialist (OBGYN)   When asked how has your weight affected you? She states: Has affected self-esteem, Having fatigue, and Having poor endurance  Contributing factors to her weight change:  Life event, Pregnancy (having daughter 3 years ago)   Some associated conditions: Other: irregular menses  Current nutrition plan: None  Current level of physical activity: None prior to this week. Started walking on treadmill for 30 minutes this week (3 days so far).   Current or previous pharmacotherapy: Tried GLP-1 + [facility]) for 4 weeks in the past and never continued due to being back ordered. Was unable to increase to next dose.   Response to medication:  Couldn't located medication [facility]) was only taking for 4 weeks.    Barriers to weight loss that patient expresses a concern about today: lack of time for self-care, low volume of physical activity at present , and emotional eating.   What she hopes to accomplish:  Adopt healthier eating patterns, improve appearance, and improve self-confidence   Past Medical History:  Diagnosis Date   Anxiety with depression    Dehydration 05/09/2015   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HCC) 05/16/2015   Hyponatremia 05/09/2015   Lyme disease    Migraine    Orthodontics    has bracees   Petechiae 05/09/2015   Pneumonia 05/08/2015   Pyelonephritis    Reflux    as infant   Thrombocytopenia (HCC)      Objective:  BP 104/69   Pulse 88   Temp 99.1 F (37.3 C)   Ht 5\' 4"   (1.626 m)   Wt 189 lb (85.7 kg)   LMP 10/13/2023 (Approximate)   SpO2 100%   BMI 32.44 kg/m  She was weighed on the bioimpedance scale: Body mass index is 32.44 kg/m.  Visceral Fat %: 6,   Body Fat %: 39.4   Vitals Temp: 99.1 F (37.3 C) BP: 104/69 Pulse Rate: 88 SpO2: 100 %   Anthropometric Measurements Height: 5\' 4"  (1.626 m) Weight: 189 lb (85.7 kg) BMI (Calculated): 32.43 Peak Weight: 192 lb   Body Composition  Body Fat %: 39.4 % Fat Mass (lbs): 74.6 lbs Muscle Mass (lbs): 108.8 lbs Total Body Water (lbs): 79 lbs Visceral Fat Rating : 6   Other Clinical Data Fasting: no Labs: no Comments: infor session     General: Well Developed, well nourished, and in no acute distress.  HEENT: Normocephalic, atraumatic; EOMI, sclerae are anicteric. Skin: Warm and dry, good turgor Chest:  Normal excursion, shape, no gross ABN Respiratory: No conversational dyspnea; speaking in full sentences NeuroM-Sk:  Normal gross ROM * 4 extremities  Psych: A and O *3, insight adequate, mood- full    Assessment and Plan:   FOR THE DISEASE OF OBESITY:  BMI 32.0-32.9,adult - Current BMI 32.43 Obesity (BMI 30.0-34.9) Assessment & Plan: - We discussed obesity as a disease and the importance of a more detailed evaluation of all the factors contributing to the disease.  - We reviewed weight, biometrics, associated medical conditions and contributing factors with  patient. she would benefit from weight loss therapy via a modified calorie, low-carb, high-protein nutritional plan tailored to their REE (resting energy expenditure) which will be determined by indirect calorimetry at their next office visit.    Multifaceted obesity treatment plan: Action Plan: - She was weighed on the bioimpedance scale and results were discussed and documented in the synopsis.   - Vanessa Vincent will complete provided nutritional and psychosocial assessment questionnaire before the next  appointment.  - She will be scheduled for indirect calorimetry to determine resting energy expenditure in a fasting state.  This will allow [place] to create a reduced calorie, high-protein meal plan to promote loss of fat mass while preserving muscle mass.  - We will also assess for cardiometabolic risk and nutritional derangements via an ECG and fasting serologies at her next appointment.  - She was encouraged to work on amassing support from family and friends to begin their weight loss journey.   - Work on eliminating or reducing the presence of highly processed, poorly nutritious, calorie-dense foods in the home.  Obesity Education Performed Today: - Patient was counseled on nutritional approaches to weight loss and benefits of reducing processed foods and consuming plant-based foods and high quality protein as part of nutritional weight management program.   - We discussed the importance of long term lifestyle changes which include nutrition, exercise and behavioral modifications as well as the importance of customizing this to her specific health and social needs.   - We discussed the benefits of reaching a healthier weight to alleviate the symptoms of existing conditions and reduce the risks of the biomechanical, metabolic and psychological effects of obesity.  - Was counseled on the health benefits of losing 5%-10% of total body weight.  - Was counseled on our cognitive behavorial therapy program, lead by our bariatric psychologist, who focuses on emotional eating and creating positive behavorial change.  - Was counseled on bariatric pharmacotherapy and how this may be used as an adjunct in their weight management     Vanessa Vincent appears to be in the action stage of change and states they are ready to start intensive lifestyle modifications and behavioral modifications.  It was recommended that she follow up in the next 1-2 weeks to review the above steps, and to continue with treatment of  their chronic disease state of obesity  FOR OTHER CONDITIONS RELATED TO THE DISEASE OF OBESITY: Constipation, unspecified constipation type Assessment & Plan: Constipation well controlled. Pt reports a hx of constipation since middle school. Pt was referred to GI by PCP and had a colonoscopy in 02/2022. Results were normal, no abnormal findings. Vanessa Vincent was started on Linzess at the time, which she is currently taking as needed, managed by Dr. Wyline Mood of GI. Advised pt to drink sufficient amounts of water, at least half her body weight in ounces of water per day. Continue with current regimen per GI team. Recommended pt to follow up with PCP/specialists as directed by them.    Attestations:  - Reviewed by clinician on day of visit: allergies, medications, problem list, medical history, surgical history, family history, social history, and previous encounter notes pertinent to obesity diagnosis.  33 minutes was spent today on this visit including the above counseling, pre-visit chart review, and post-visit documentation.  Over 50% of this time was spent in direct, face-to-face counseling and coordination of care  I, Isabelle Course , acting as a medical scribe for Thomasene Lot, DO., have compiled all relevant documentation for today's  office visit on behalf of Thomasene Lot, DO, while in the presence of Thomasene Lot, DO.  I have reviewed the above documentation for accuracy and completeness, and I agree with the above. [name], D.O.  The 21st Century Cures Act was signed into law in 2016 which includes the topic of electronic health records.  This provides immediate access to information in MyChart.  This includes consultation notes, operative notes, office notes, lab results and pathology reports.  If you have any questions about what you read please let us know at your next visit so we can discuss your concerns and take corrective action if need be.  We are right here with you!

## 2023-11-25 ENCOUNTER — Encounter: Payer: Self-pay | Admitting: Licensed Practical Nurse

## 2023-12-15 ENCOUNTER — Ambulatory Visit (INDEPENDENT_AMBULATORY_CARE_PROVIDER_SITE_OTHER): Payer: Medicaid Other | Admitting: Family Medicine

## 2023-12-15 ENCOUNTER — Encounter (INDEPENDENT_AMBULATORY_CARE_PROVIDER_SITE_OTHER): Payer: Self-pay | Admitting: Family Medicine

## 2023-12-15 VITALS — BP 118/73 | HR 69 | Temp 97.7°F | Ht 64.0 in | Wt 189.0 lb

## 2023-12-15 DIAGNOSIS — R0602 Shortness of breath: Secondary | ICD-10-CM | POA: Diagnosis not present

## 2023-12-15 DIAGNOSIS — E66811 Obesity, class 1: Secondary | ICD-10-CM

## 2023-12-15 DIAGNOSIS — R5383 Other fatigue: Secondary | ICD-10-CM | POA: Diagnosis not present

## 2023-12-15 DIAGNOSIS — Z1331 Encounter for screening for depression: Secondary | ICD-10-CM

## 2023-12-15 DIAGNOSIS — E669 Obesity, unspecified: Secondary | ICD-10-CM | POA: Diagnosis not present

## 2023-12-15 DIAGNOSIS — F39 Unspecified mood [affective] disorder: Secondary | ICD-10-CM | POA: Diagnosis not present

## 2023-12-15 DIAGNOSIS — Z6832 Body mass index (BMI) 32.0-32.9, adult: Secondary | ICD-10-CM

## 2023-12-15 DIAGNOSIS — K5900 Constipation, unspecified: Secondary | ICD-10-CM

## 2023-12-15 DIAGNOSIS — F5089 Other specified eating disorder: Secondary | ICD-10-CM

## 2023-12-15 NOTE — Progress Notes (Signed)
 Vanessa Vincent, D.O.  ABFM, ABOM Specializing in Clinical Bariatric Medicine Office located at: [address]  [place], [place]  [address]   Bariatric Medicine Visit  Dear Vanessa Vincent, Vanessa Penta, MD   Thank you for referring Vanessa Vincent to our clinic today for evaluation.  We performed a consultation to discuss her options for treatment and educate the patient on her disease state.  The following note includes my evaluation and treatment recommendations.   Please do not hesitate to reach out to me directly if you have any further concerns.   Assessment and Plan:   FOR THE DISEASE OF Vanessa: BMI 32.0-32.9,adult - Current BMI 32.43 Vanessa (BMI 30.0-34.9) Assessment & Plan: Recommended Dietary Goals Vanessa Vincent is currently in the action stage of change. As such, her goal is to start our weight management plan.  She has agreed to:  Start the CAT 2 MP with L options   Behavioral Intervention We discussed the following Behavioral Modification Strategies today: being on track with weight loss during future pregnancy, increasing lean protein intake to established goals, increasing vegetables, increasing water intake , keeping healthy foods at home, identifying sources and decreasing liquid calories, and decreasing eating out or consumption of processed foods, and making healthy choices when eating convenient foods  Additional resources provided today: handout on CAT 2 meal plan, Handout on CAT 1-2 lunch options, and Handout on non-starchy vegetables  Evidence-based interventions for health behavior change were utilized today including the discussion of self monitoring techniques, problem-solving barriers and SMART goal setting techniques.    Pt will specifically work on:n/a for next visit.    Recommended Physical Activity Goals Vanessa Vincent has been advised to work up to 150 minutes of moderate intensity aerobic activity a week and strengthening exercises 2-3 times per week for  cardiovascular health, weight loss maintenance and preservation of muscle mass.   She has agreed to : maintain current level of activity.    Pharmacotherapy We discussed various medication options to help Vanessa Vincent with her weight loss efforts and we both agreed to : continue with nutritional and behavioral strategies   FOR ASSOCIATED CONDITIONS ADDRESSED TODAY:  Fatigue Assessment & Plan: Vanessa Vincent does feel that her weight is causing her energy to be lower than it should be. Fatigue may be related to Vanessa, depression or many other causes. she does not appear to have any red flag symptoms and this appears to most likely be related to her current lifestyle habits and dietary intake.  Labs will be ordered and reviewed with her at their next office visit in two weeks.  Epworth sleepiness scale is 1 and appears to be OR not be (delete one) within normal limits. Vanessa Vincent denies daytime somnolence and admits to waking up still tired. Patient has a history of symptoms of occasional morning headache. Vanessa Vincent generally gets 7 or 8 hours of sleep per night, and states that she has generally restful sleep. Snoring is not present. Apneic episodes is not present.   ECG: Performed and reviewed/ interpreted independently.  Normal sinus rhythm, rate 68 bpm; reassuring without any acute abnormalities, will continue to monitor for symptoms    Shortness of breath on exertion Assessment & Plan: Vanessa Vincent does feel that she gets out of breath more easily than she used to when she exercises and seems to be worsening over time with weight gain.  This has gotten worse recently. Vanessa Vincent denies shortness of breath at rest or orthopnea. Vanessa Vincent shortness of breath appears to be Vanessa  related and exercise induced, as they do not appear to have any "red flag" symptoms/ concerns today.  Also, this condition appears to be related to a state of poor cardiovascular conditioning   Obtain labs today and will be reviewed  with her at their next office visit in two weeks.  Indirect Calorimeter completed today to help guide our dietary regimen. It shows a VO2 of 280 and a REE of 1930.  Her calculated basal metabolic rate is 4098 thus her resting energy expenditure is better than expected.  Patient agreed to work on weight loss at this time.  As Vanessa Vincent progresses through our weight loss program, we will gradually increase exercise as tolerated to treat her current condition.   If Vanessa Vincent follows our recommendations and loses 5-10% of their weight without improvement of her shortness of breath or if at any time, symptoms become more concerning, they agree to urgently follow up with their PCP/ specialist for further consideration/ evaluation.   Vanessa Vincent verbalizes agreement with this plan.    Depression Screen  Mood disorder (HCC)/Emotional Eating Assessment & Plan: Vanessa Vincent is not currently on any medications for her mood. Her Food and Mood (modified PHQ-9) score was positive at 21.She denies any SI/HI. Reports her mood has been overall well-controlled. Vanessa Vincent admits to eating when stressed, bored, sad, and to comfort herself. She does not currently have a Careers information officer.   Reminded patient of the importance of following their prudent nutrition plan and how food can affect mood as well to support emotional wellbeing. Importance of not skipping meals discussed.  Patient accepted referall to Dr. Dewaine Conger, our Bariatric Psychologist, for evaluation due to her elevated PHQ-9 score and significant struggles with emotional eating. We will monitor closely alongside PCP.  Relevant Orders: -     VITAMIN D 25 Hydroxy (Vit-D Deficiency, Fractures) -     TSH -     T4, free -     Lipid Panel With LDL/HDL Ratio -     Insulin, random -     Folate -     Comprehensive metabolic panel -     CBC with Differential/Platelet -     Vitamin B12   Constipation, unspecified constipation type Assessment & Plan: Vanessa Vincent is taking  Linzess 290 mcg as needed to manage her constipation. She has been using the treadmill and reports this has helped with easier bowel movements. She has no acute concerns today.  Increasing water intake and continuing with physical activity may help improve this condition.   FOLLOW UP:   Follow up in 2 weeks. She was informed of the importance of frequent follow up visits to maximize her success with intensive lifestyle modifications for her multiple health conditions.  Vanessa Vincent is aware that we will review all of her lab results at our next visit.  She is aware that if anything is critical/ life threatening with the results, we will be contacting her via MyChart prior to the office visit to discuss management.    Chief Complaint:   Vanessa Vincent (MR# [id number]) is a pleasant 23 y.o. female who presents for evaluation and treatment of Vanessa and related comorbidities. Current BMI is Body mass index is 32.44 kg/m. Vanessa Vincent has been struggling with her weight for many years and has been unsuccessful in either losing weight, maintaining weight loss, or reaching her healthy weight goal.  Vanessa Vincent is currently in the action stage of change and ready to dedicate time achieving and  maintaining a healthier weight. Vanessa Vincent is interested in becoming our patient and working on intensive lifestyle modifications including (but not limited to) diet and exercise for weight loss.  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 Patient is married to Vanessa Vincent  and has 1 child. She lives with spouse Vanessa Vincent and 26 year old daughter.  Walks 30 mins few times a week  Desires to be 150 lbs in a year  Started gaining weight from birth control and after giving birth/stress of having new baby  Heaviest 192 Never been on a diet -- wanted to cut down on unhealthy foods  Take out or fast food 4-5 times a week  Grocery shops 2x monthly  Craves sweet foods and "random  things"  Snacks on sweet foods  Occasionally skips breakfast  Drinks coffee with creamer and sweet tea and carnation breakfast essentials occasionally  WFH: "Eating all my food even though I'm full" and emotional eating  Subjective:   This is the patient's first visit at [facility].  The patient's NEW PATIENT PACKET that they filled out prior to today's office visit was reviewed at length and information from that paperwork was included within the following office visit note.    Included in the packet: current and past health history, medications, allergies, ROS, gynecologic history (women only), surgical history, family history, social history, weight history, weight loss surgery history (for those that have had weight loss surgery), nutritional evaluation, mood and food questionnaire along with a depression screening (PHQ9) on all patients, an Epworth questionnaire, sleep habits questionnaire, patient life and health improvement goals questionnaire. These will all be scanned into the patient's chart under the "media" tab.   Review of Systems: Please refer to new patient packet scanned into media. Pertinent positives were addressed with patient today.  Reviewed by clinician on day of visit: allergies, medications, problem list, medical history, surgical history, family history, social history, and previous encounter notes.  During the visit, I independently reviewed the patient's EKG, bioimpedance scale results, and indirect calorimeter results. I used this information to tailor a meal plan for the patient that will help Vanessa BERNSTEIN to lose weight and will improve her Vanessa-related conditions going forward.  I performed a medically necessary appropriate examination and/or evaluation. I discussed the assessment and treatment plan with the patient. The patient was provided an opportunity to ask questions and all were answered. The patient agreed with the plan and  demonstrated an understanding of the instructions. Labs were ordered today (unless patient declined them) and will be reviewed with the patient at our next visit unless more critical results need to be addressed immediately. Clinical information was updated and documented in the EMR.    Objective:   PHYSICAL EXAM: Blood pressure 118/73, pulse 69, temperature 97.7 F (36.5 C), height 5\' 4"  (1.626 m), weight 189 lb (85.7 kg), last menstrual period 11/13/2023, SpO2 100%. Body mass index is 32.44 kg/m.  General: Well Developed, well nourished, and in no acute distress.  HEENT: Normocephalic, atraumatic; EOMI, sclerae are anicteric. Skin: Warm and dry, good turgor Chest:  Normal excursion, shape, no gross ABN Respiratory: No conversational dyspnea; speaking in full sentences NeuroM-Sk:  Normal gross ROM * 4 extremities  Psych: A and O *3, insight adequate, mood- full   Anthropometric Measurements Height: 5\' 4"  (1.626 m) Weight: 189 lb (85.7 kg) BMI (Calculated): 32.43 Starting Weight: 189 lb Peak Weight: 192 lb Waist Measurement : 37 inches   Body Composition  Body Fat %: 41 % Fat Mass (lbs): 77.8 lbs Muscle Mass (lbs): 106.2 lbs Total Body Water (lbs): 80 lbs Visceral Fat Rating : 7   Other Clinical Data RMR: 1930 Fasting: yes Labs: yes Today's Visit #: 1 Starting Date: 12/15/23    DIAGNOSTIC DATA REVIEWED:  BMET    Component Value Date/Time   NA 140 10/15/2018 1902   K 3.5 10/15/2018 1902   CL 104 10/15/2018 1902   CO2 27 10/15/2018 1902   GLUCOSE 93 10/15/2018 1902   BUN 13 10/15/2018 1902   CREATININE 0.61 10/15/2018 1902   CALCIUM 9.5 10/15/2018 1902   GFRNONAA >60 10/15/2018 1902   GFRAA >60 10/15/2018 1902   Lab Results  Component Value Date   HGBA1C 5.2 11/07/2023   No results found for: "INSULIN" Lab Results  Component Value Date   TSH 2.050 03/11/2022   CBC    Component Value Date/Time   WBC 5.2 03/11/2022 1450   WBC 16.2 (H) 09/21/2020  0648   RBC 4.50 03/11/2022 1450   RBC 3.20 (L) 09/21/2020 0648   HGB 13.4 03/11/2022 1450   HCT 40.5 03/11/2022 1450   PLT 175 03/11/2022 1450   MCV 90 03/11/2022 1450   MCH 29.8 03/11/2022 1450   MCH 31.9 09/21/2020 0648   MCHC 33.1 03/11/2022 1450   MCHC 33.9 09/21/2020 0648   RDW 12.7 03/11/2022 1450   Iron Studies    Component Value Date/Time   FERRITIN 174 05/09/2015 1244   Lipid Panel  No results found for: "CHOL", "TRIG", "HDL", "CHOLHDL", "VLDL", "LDLCALC", "LDLDIRECT" Hepatic Function Panel     Component Value Date/Time   PROT 8.1 10/15/2018 1902   ALBUMIN 5.0 10/15/2018 1902   AST 21 10/15/2018 1902   ALT 18 10/15/2018 1902   ALKPHOS 79 10/15/2018 1902   BILITOT 1.2 10/15/2018 1902      Component Value Date/Time   TSH 2.050 03/11/2022 1450   Nutritional No results found for: "VD25OH"  Attestation Statements:   I, Camryn Mix, acting as a Stage manager for [employer], DO., have compiled all relevant documentation for today's office visit on behalf of Thomasene Lot, DO, while in the presence of [employer], DO.  Reviewed by clinician on day of visit: allergies, medications, problem list, medical history, surgical history, family history, social history, and previous encounter notes pertinent to patient's Vanessa diagnosis. I have spent 43 minutes in the care of the patient today including: preparing to see patient (e.g. review and interpretation of tests, old notes ), obtaining and/or reviewing separately obtained history, performing a medically appropriate examination or evaluation, counseling and educating the patient, ordering medications, test or procedures, documenting clinical information in the electronic or other health care record, and independently interpreting results and communicating results to the patient, family, or caregiver   I have reviewed the above documentation for accuracy and completeness, and I agree with the above. Vanessa Vincent, D.O.  The 21st Century Cures Act was signed into law in 2016 which includes the topic of electronic health records.  This provides immediate access to information in MyChart.  This includes consultation notes, operative notes, office notes, lab results and pathology reports.  If you have any questions about what you read please let us know at your next visit so we can discuss your concerns and take corrective action if need be.  We are right here with you.

## 2023-12-19 LAB — TSH: TSH: 2.62 u[IU]/mL (ref 0.450–4.500)

## 2023-12-19 LAB — CBC WITH DIFFERENTIAL/PLATELET
Basophils Absolute: 0 10*3/uL (ref 0.0–0.2)
Basos: 0 %
EOS (ABSOLUTE): 0.1 10*3/uL (ref 0.0–0.4)
Eos: 2 %
Hematocrit: 41.2 % (ref 34.0–46.6)
Hemoglobin: 13.3 g/dL (ref 11.1–15.9)
Immature Grans (Abs): 0 10*3/uL (ref 0.0–0.1)
Immature Granulocytes: 0 %
Lymphocytes Absolute: 1.8 10*3/uL (ref 0.7–3.1)
Lymphs: 32 %
MCH: 29.5 pg (ref 26.6–33.0)
MCHC: 32.3 g/dL (ref 31.5–35.7)
MCV: 91 fL (ref 79–97)
Monocytes Absolute: 0.3 10*3/uL (ref 0.1–0.9)
Monocytes: 6 %
Neutrophils Absolute: 3.3 10*3/uL (ref 1.4–7.0)
Neutrophils: 60 %
Platelets: 183 10*3/uL (ref 150–450)
RBC: 4.51 x10E6/uL (ref 3.77–5.28)
RDW: 12.5 % (ref 11.7–15.4)
WBC: 5.6 10*3/uL (ref 3.4–10.8)

## 2023-12-19 LAB — COMPREHENSIVE METABOLIC PANEL
ALT: 14 IU/L (ref 0–32)
AST: 18 IU/L (ref 0–40)
Albumin: 4.4 g/dL (ref 4.0–5.0)
Alkaline Phosphatase: 77 IU/L (ref 44–121)
BUN/Creatinine Ratio: 20 (ref 9–23)
BUN: 13 mg/dL (ref 6–20)
Bilirubin Total: 0.3 mg/dL (ref 0.0–1.2)
CO2: 24 mmol/L (ref 20–29)
Calcium: 9.3 mg/dL (ref 8.7–10.2)
Chloride: 106 mmol/L (ref 96–106)
Creatinine, Ser: 0.66 mg/dL (ref 0.57–1.00)
Globulin, Total: 2.3 g/dL (ref 1.5–4.5)
Glucose: 83 mg/dL (ref 70–99)
Potassium: 4.6 mmol/L (ref 3.5–5.2)
Sodium: 142 mmol/L (ref 134–144)
Total Protein: 6.7 g/dL (ref 6.0–8.5)
eGFR: 126 mL/min/{1.73_m2} (ref >59)

## 2023-12-19 LAB — LIPID PANEL WITH LDL/HDL RATIO
Cholesterol, Total: 142 mg/dL (ref 100–199)
HDL: 55 mg/dL (ref >39)
LDL Chol Calc (NIH): 70 mg/dL (ref 0–99)
LDL/HDL Ratio: 1.3 ratio (ref 0.0–3.2)
Triglycerides: 92 mg/dL (ref 0–149)
VLDL Cholesterol Cal: 17 mg/dL (ref 5–40)

## 2023-12-19 LAB — VITAMIN D 25 HYDROXY (VIT D DEFICIENCY, FRACTURES): Vit D, 25-Hydroxy: 21.9 ng/mL — ABNORMAL LOW (ref 30.0–100.0)

## 2023-12-19 LAB — INSULIN, RANDOM: INSULIN: 14.5 u[IU]/mL (ref 2.6–24.9)

## 2023-12-19 LAB — VITAMIN B12: Vitamin B-12: 396 pg/mL (ref 232–1245)

## 2023-12-19 LAB — T4, FREE: Free T4: 1.08 ng/dL (ref 0.82–1.77)

## 2023-12-19 LAB — FOLATE: Folate: 11.4 ng/mL (ref >3.0)

## 2023-12-29 ENCOUNTER — Telehealth (INDEPENDENT_AMBULATORY_CARE_PROVIDER_SITE_OTHER): Payer: Medicaid Other | Admitting: Psychology

## 2023-12-29 ENCOUNTER — Encounter (INDEPENDENT_AMBULATORY_CARE_PROVIDER_SITE_OTHER): Payer: Self-pay

## 2023-12-29 ENCOUNTER — Encounter (INDEPENDENT_AMBULATORY_CARE_PROVIDER_SITE_OTHER): Payer: Self-pay | Admitting: Family Medicine

## 2023-12-29 ENCOUNTER — Ambulatory Visit (INDEPENDENT_AMBULATORY_CARE_PROVIDER_SITE_OTHER): Payer: Medicaid Other | Admitting: Family Medicine

## 2023-12-29 VITALS — BP 108/70 | HR 71 | Temp 97.5°F | Ht 64.0 in | Wt 186.0 lb

## 2023-12-29 DIAGNOSIS — Z6832 Body mass index (BMI) 32.0-32.9, adult: Secondary | ICD-10-CM

## 2023-12-29 DIAGNOSIS — E669 Obesity, unspecified: Secondary | ICD-10-CM

## 2023-12-29 DIAGNOSIS — E88819 Insulin resistance, unspecified: Secondary | ICD-10-CM

## 2023-12-29 DIAGNOSIS — K5900 Constipation, unspecified: Secondary | ICD-10-CM

## 2023-12-29 DIAGNOSIS — E559 Vitamin D deficiency, unspecified: Secondary | ICD-10-CM | POA: Diagnosis not present

## 2023-12-29 DIAGNOSIS — F39 Unspecified mood [affective] disorder: Secondary | ICD-10-CM

## 2023-12-29 DIAGNOSIS — F5089 Other specified eating disorder: Secondary | ICD-10-CM

## 2023-12-29 DIAGNOSIS — Z6831 Body mass index (BMI) 31.0-31.9, adult: Secondary | ICD-10-CM

## 2023-12-29 DIAGNOSIS — E66811 Obesity, class 1: Secondary | ICD-10-CM

## 2023-12-29 DIAGNOSIS — E538 Deficiency of other specified B group vitamins: Secondary | ICD-10-CM

## 2023-12-29 MED ORDER — CYANOCOBALAMIN 500 MCG PO TABS: 500.0000 ug | ORAL_TABLET | Freq: Every day | ORAL | Status: AC

## 2023-12-29 MED ORDER — VITAMIN D (ERGOCALCIFEROL) 1.25 MG (50000 UNIT) PO CAPS: 50000.0000 [IU] | ORAL_CAPSULE | ORAL | 0 refills | Status: AC

## 2023-12-29 NOTE — Progress Notes (Signed)
 Vanessa Vincent, D.O.  ABFM, ABOM Clinical Bariatric Medicine Physician  Office located at: [address] [place], [place]  [address]     Assessment and Plan:   FOR THE DISEASE OF OBESITY: BMI 32.0-32.9,adult - Current BMI 32.43 Obesity (BMI 30.0-34.9) Assessment & Plan: Since last office visit on 12/15/2023, patient's muscle mass has increased by 0.6 lbs. Fat mass has decreased by 3.4 lbs. Total body water has decreased by 1.8 lbs.  Counseling done on how various foods will affect these numbers and how to maximize success  Total lbs lost to date: 3 lbs Total weight loss percentage to date: 1.59%    Recommended Dietary Goals Vanessa Vincent is currently in the action stage of change. As such, her goal is to continue weight management plan.  She has agreed to: continue current plan   Behavioral Intervention We discussed the following today: Ways to make eating eggs more enjoyable, continue to work on implementation of reduced calorie nutritional plan and continue to practice mindfulness when eating  Additional resources provided today: Handout on PreDM and IR, handout on CAT 2 meal plan and Handout on CAT 1-2 lunch options  Evidence-based interventions for health behavior change were utilized today including the discussion of self monitoring techniques, problem-solving barriers and SMART goal setting techniques.  Regarding patient's less desirable eating habits and patterns, we employed the technique of small changes.   Pt will specifically work on: n/a   Recommended Physical Activity Goals Vanessa Vincent has been advised to work up to 150 minutes of moderate intensity aerobic activity a week and strengthening exercises 2-3 times per week for cardiovascular health, weight loss maintenance and preservation of muscle mass.   She has agreed to :  Think about enjoyable ways to increase daily physical activity and overcoming barriers to exercise   Pharmacotherapy We discussed various  medication options to help Vanessa Vincent with her weight loss efforts and we both agreed to : continue with nutritional and behavioral strategies   FOR ASSOCIATED CONDITIONS ADDRESSED TODAY:  Insulin resistance - new onset Assessment & Plan: Lab Results  Component Value Date   HGBA1C 5.2 11/07/2023   INSULIN 14.5 12/15/2023    Lab Results  Component Value Date   WBC 5.6 12/15/2023   HGB 13.3 12/15/2023   HCT 41.2 12/15/2023   MCV 91 12/15/2023   PLT 183 12/15/2023   Lab Results  Component Value Date   CREATININE 0.66 12/15/2023   BUN 13 12/15/2023   NA 142 12/15/2023   K 4.6 12/15/2023   CL 106 12/15/2023   CO2 24 12/15/2023      Component Value Date/Time   PROT 6.7 12/15/2023 0945   ALBUMIN 4.4 12/15/2023 0945   AST 18 12/15/2023 0945   ALT 14 12/15/2023 0945   ALKPHOS 77 12/15/2023 0945   BILITOT 0.3 12/15/2023 0945   Lab Results  Component Value Date   CHOL 142 12/15/2023   HDL 55 12/15/2023   LDLCALC 70 12/15/2023   TRIG 92 12/15/2023   Lab Results  Component Value Date   TSH 2.620 12/15/2023   FREET4 1.08 12/15/2023  This is a new diagnosis for Vanessa Vincent. Insulin levels are almost 3 times the goal of <5. Thyroid, cholesterol, liver, kidneys, and blood counts are WNL.  I counseled patient on pathophysiology of the disease process of I.R. Stressed importance of dietary and lifestyle modifications to result in weight loss as first line treatment. Explained role of simple carbs and insulin levels on  hunger and cravings. Handout provided at pt's request after education provided. All concerns/questions addressed. Vanessa Vincent will continue to work on weight loss, exercise, via their meal plan we devised to help decrease the risk of progressing to pre-diabetes. I recommended the patient to drink approximately half of their body weight in ounces of water daily. Additionally, have an extra bottle of water for every 30 minutes of exercise. Will continue to monitor levels as  they pertain to her weight loss journey.   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 She reports noticing a decrease in emotional eating and cravings when eating according to MP. Vanessa Vincent endorses scheduling appointment with Dr. Dewaine Conger, however had to reschedule d/t Dr. Dewaine Conger having illness. Reminded patient of the importance of following their prudent nutrition plan and how food can affect mood as well to support emotional wellbeing. We will continue to monitor closely alongside PCP.   Vitamin D deficiency - new onset Assessment & Plan: Lab Results  Component Value Date   VD25OH 21.9 (L) 12/15/2023  Per last obtained results, Vit D levels were below goal at 21.9. Educated pt that Vit D levels should be between 50-70. Will start on ERGO 50000 once weekly. All questions answered thoroughly. Will recheck levels in 3 months to observe improvement.  Relevant Orders: -     Vitamin D (Ergocalciferol); Take 1 capsule (50,000 Units total) by mouth every 7 (seven) days.  Dispense: 4 capsule; Refill: 0   B12 deficiency - new onset Assessment & Plan: Lab Results  Component Value Date   VITAMINB12 396 12/15/2023   FOLATE 11.4 12/15/2023  Reviewed last obtained results with pt, B12 levels were below goal at 396. Educated pt that ideally levels should be >500. Folate levels are WNL. Will start Cyanocobalamin 500 mcg daily d/t low B12 levels. Will recheck levels in 3 months for changes.  Relevant Orders: -     Cyanocobalamin; Take 1 tablet (500 mcg total) by mouth daily.  FOLLOW UP:   Return in about 2 weeks (around 01/12/2024). She was informed of the importance of frequent follow up visits to maximize her success with intensive lifestyle modifications for her multiple health conditions.  Subjective:   Chief complaint: Obesity Vanessa Vincent is here to discuss her progress with her obesity treatment plan. She is on the the  Category 2 Plan with L options and states she is following her eating plan approximately 60% of the time. She states she is exercising 30 minutes 2-3 days per week.  Interval History:  Vanessa Vincent is here today for her first follow-up office visit since starting the program with [place]. Patient is off to a good start and has lost 3 lbs. Carrin reports getting sick immediately following LOV with viral gastroenteritis, she did not eat for a few days but was still drinking water. When she restarted eating, pt endorses eating foods like soup. When she was eating on plan, pt reports trying to "rotate meals" so it did not feel repetitive. She feels eating on plans helps her feel more full and satisfied throughout the day. The biggest obstacle for her was finding good breakfast options besides eggs.   All blood work/ lab tests that were recently ordered by myself or an outside provider were reviewed with patient today per their request. Extended time was spent counseling her on all new disease processes that were discovered or preexisting ones that are affected by BMI.  she understands  that many of these abnormalities will need to monitored regularly along with the current treatment plan of prudent dietary changes, in which we are making each and every office visit, to improve these health parameters.  Pharmacotherapy for weight loss: She is not currently taking medications  for medical weight loss.  Review of Systems:  Pertinent positives were addressed with patient today.  Reviewed by clinician on day of visit: allergies, medications, problem list, medical history, surgical history, family history, social history, and previous encounter notes.   Weight Summary and Biometrics   Weight Lost Since Last Visit: 0  Weight Gained Since Last Visit: 189 lb   Vitals Temp: (!) 97.5 F (36.4 C) BP: 108/70 Pulse Rate: 71 SpO2: 100 %   Anthropometric Measurements Height: 5\' 4"  (1.626 m) Weight: 186  lb (84.4 kg) BMI (Calculated): 31.91 Weight at Last Visit: 3 lb Weight Lost Since Last Visit: 0 Weight Gained Since Last Visit: 189 lb Starting Weight: 189 lb Total Weight Loss (lbs): 3 lb (1.361 kg) Peak Weight: 192 lb   Body Composition  Body Fat %: 39.8 % Fat Mass (lbs): 74.4 lbs Muscle Mass (lbs): 106.8 lbs Total Body Water (lbs): 78.2 lbs Visceral Fat Rating : 6   Other Clinical Data Fasting: No Labs: No Today's Visit #: 2 Starting Date: 12/15/23     Objective:   PHYSICAL EXAM:  Blood pressure 108/70, pulse 71, temperature (!) 97.5 F (36.4 C), height 5\' 4"  (1.626 m), weight 186 lb (84.4 kg), last menstrual period 11/13/2023, SpO2 100%. Body mass index is 31.93 kg/m.  General: she is overweight, cooperative and in no acute distress.   HEENT: EOMI, sclerae are anicteric. Lungs: Normal breathing effort, no conversational dyspnea. M-Sk:  Normal gross ROM * 4 extremities  PSYCH: Has normal mood, affect and thought process. Neurologic: No gross sensory or motor deficits. Well developed, A and O * 3  DIAGNOSTIC DATA REVIEWED:  BMET    Component Value Date/Time   NA 142 12/15/2023 0945   K 4.6 12/15/2023 0945   CL 106 12/15/2023 0945   CO2 24 12/15/2023 0945   GLUCOSE 83 12/15/2023 0945   GLUCOSE 93 10/15/2018 1902   BUN 13 12/15/2023 0945   CREATININE 0.66 12/15/2023 0945   CALCIUM 9.3 12/15/2023 0945   GFRNONAA >60 10/15/2018 1902   GFRAA >60 10/15/2018 1902   Lab Results  Component Value Date   HGBA1C 5.2 11/07/2023   Lab Results  Component Value Date   INSULIN 14.5 12/15/2023   Lab Results  Component Value Date   TSH 2.620 12/15/2023   CBC    Component Value Date/Time   WBC 5.6 12/15/2023 0945   WBC 16.2 (H) 09/21/2020 0648   RBC 4.51 12/15/2023 0945   RBC 3.20 (L) 09/21/2020 0648   HGB 13.3 12/15/2023 0945   HCT 41.2 12/15/2023 0945   PLT 183 12/15/2023 0945   MCV 91 12/15/2023 0945   MCH 29.5 12/15/2023 0945   MCH 31.9 09/21/2020  0648   MCHC 32.3 12/15/2023 0945   MCHC 33.9 09/21/2020 0648   RDW 12.5 12/15/2023 0945   Iron Studies    Component Value Date/Time   FERRITIN 174 05/09/2015 1244   Lipid Panel     Component Value Date/Time   CHOL 142 12/15/2023 0945   TRIG 92 12/15/2023 0945   HDL 55 12/15/2023 0945   LDLCALC 70 12/15/2023 0945   Hepatic Function Panel     Component Value Date/Time   PROT 6.7 12/15/2023  0945   ALBUMIN 4.4 12/15/2023 0945   AST 18 12/15/2023 0945   ALT 14 12/15/2023 0945   ALKPHOS 77 12/15/2023 0945   BILITOT 0.3 12/15/2023 0945      Component Value Date/Time   TSH 2.620 12/15/2023 0945   Nutritional Lab Results  Component Value Date   VD25OH 21.9 (L) 12/15/2023    Attestations:   I, Camryn Mix, acting as a Stage manager for [employer], DO., have compiled all relevant documentation for today's office visit on behalf of Thomasene Lot, DO, while in the presence of [employer], DO.  Reviewed by clinician on day of visit: allergies, medications, problem list, medical history, surgical history, family history, social history, and previous encounter notes pertinent to patient's obesity diagnosis.   I have spent 45 minutes in the care of the patient today including: preparing to see patient (e.g. review and interpretation of tests, old notes ), obtaining and/or reviewing separately obtained history, performing a medically appropriate examination or evaluation, counseling and educating the patient, ordering medications, test or procedures, documenting clinical information in the electronic or other health care record, and independently interpreting results and communicating results to the patient, family, or caregiver   I have reviewed the above documentation for accuracy and completeness, and I agree with the above. Vanessa Vincent, D.O.  The 21st Century Cures Act was signed into law in 2016 which includes the topic of electronic health records.  This provides  immediate access to information in MyChart.  This includes consultation notes, operative notes, office notes, lab results and pathology reports.  If you have any questions about what you read please let us know at your next visit so we can discuss your concerns and take corrective action if need be.  We are right here with you.

## 2024-01-12 ENCOUNTER — Ambulatory Visit (INDEPENDENT_AMBULATORY_CARE_PROVIDER_SITE_OTHER): Payer: Medicaid Other | Admitting: Family Medicine

## 2024-01-20 ENCOUNTER — Telehealth (INDEPENDENT_AMBULATORY_CARE_PROVIDER_SITE_OTHER): Admitting: Psychology

## 2024-01-22 ENCOUNTER — Other Ambulatory Visit (INDEPENDENT_AMBULATORY_CARE_PROVIDER_SITE_OTHER): Payer: Self-pay | Admitting: Family Medicine

## 2024-01-22 DIAGNOSIS — E559 Vitamin D deficiency, unspecified: Secondary | ICD-10-CM

## 2024-01-26 ENCOUNTER — Telehealth (INDEPENDENT_AMBULATORY_CARE_PROVIDER_SITE_OTHER): Admitting: Psychology

## 2024-01-26 DIAGNOSIS — F5089 Other specified eating disorder: Secondary | ICD-10-CM | POA: Diagnosis not present

## 2024-01-26 DIAGNOSIS — F909 Attention-deficit hyperactivity disorder, unspecified type: Secondary | ICD-10-CM
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
## 2024-01-28 ENCOUNTER — Encounter (INDEPENDENT_AMBULATORY_CARE_PROVIDER_SITE_OTHER): Payer: Self-pay

## 2024-01-28 ENCOUNTER — Ambulatory Visit (INDEPENDENT_AMBULATORY_CARE_PROVIDER_SITE_OTHER): Admitting: Family Medicine

## 2024-02-16 ENCOUNTER — Telehealth (INDEPENDENT_AMBULATORY_CARE_PROVIDER_SITE_OTHER): Admitting: Psychology

## 2024-02-16 ENCOUNTER — Telehealth (INDEPENDENT_AMBULATORY_CARE_PROVIDER_SITE_OTHER): Payer: Self-pay | Admitting: Psychology

## 2024-02-16 NOTE — Progress Notes (Unsigned)
  Office: [phone]  /  Fax: [phone]    Date: February 16, 2024  Appointment Start Time: *** Duration: *** minutes Provider: Catherene Close, Psy.D. Type of Session: Individual Therapy  Location of Patient: {gbptloc:23249} (private location) Location of Provider: Provider's Home (private office) Type of Contact: Telepsychological Visit via MyChart Video Visit  Session Content: This provider called Vanessa Vincent at 8:33am as she did not present for today's appointment. A HIPAA compliant voicemail was left requesting a call back.  As such, today's appointment was initiated *** minutes late.Vanessa Vincent is a 23 y.o. female presenting for a follow-up appointment to address the previously established treatment goal of increasing coping skills.Today's appointment was a telepsychological visit. Vanessa Vincent provided verbal consent for today's telepsychological appointment and she is aware she is responsible for securing confidentiality on her end of the session. Prior to proceeding with today's appointment, Vanessa Vincent's physical location at the time of this appointment was obtained as well a phone number she could be reached at in the event of technical difficulties. Vanessa Vincent and this provider participated in today's telepsychological service.   This provider conducted a brief check-in. *** Vanessa Vincent was receptive to today's appointment as evidenced by openness to sharing, responsiveness to feedback, and {gbreceptiveness:23401}.  Mental Status Examination:  Appearance: {Appearance:22431} Behavior: {Behavior:22445} Mood: {gbmood:21757} Affect: {Affect:22436} Speech: {Speech:22432} Eye Contact: {Eye Contact:22433} Psychomotor Activity: {Motor Activity:22434} Gait: {gbgait:23404} Thought Process: {thought process:22448}  Thought Content/Perception: {disturbances:22451} Orientation: {Orientation:22437} Memory/Concentration: {gbcognition:22449} Insight: {Insight:22446} Judgment: {Insight:22446}  Interventions:   {Interventions for Progress Notes:23405}  DSM-5 Diagnosis(es):  F50.89 Other Specified Feeding or Eating Disorder, Emotional and Binge Eating Behaviors and F90.9 Unspecified Attention-Deficit/Hyperactivity Disorder  Treatment Goal & Progress: During the initial appointment with this provider, the following treatment goal was established: increase coping skills. Vanessa Vincent has demonstrated progress in her goal as evidenced by {gbtxprogress:22839}. Vanessa Vincent also {gbtxprogress2:22951}.  Plan: The next appointment is scheduled for *** at ***, which will be via MyChart Video Visit. The next session will focus on {Plan for Next Appointment:23400}.   Catherene Close, PsyD

## 2024-02-16 NOTE — Telephone Encounter (Signed)
  Office: [phone]  /  Fax: [phone]  Date of Call: February 16, 2024  Time of Call: 8:33am Provider: Catherene Close, PsyD  CONTENT: This provider called Vanessa Vincent to check-in as she did not present for today's MyChart Video Visit appointment. A HIPAA compliant voicemail was left requesting a call back. Of note, this provider stayed on the MyChart Video Visit appointment for 5 minutes prior to signing off per the clinic's grace period policy.    PLAN: This provider will wait for [name] to call back. No further follow-up planned by this provider.

## 2024-03-09 LAB — PANORAMA PRENATAL TEST FULL PANEL:PANORAMA TEST PLUS 5 ADDITIONAL MICRODELETIONS: FETAL FRACTION: 4.8

## 2024-04-28 ENCOUNTER — Encounter: Payer: Self-pay | Admitting: Urology

## 2024-07-28 NOTE — Progress Notes (Signed)
   Provider: VERNELL VEAR MAXIN, CNM Division: General Obstetrics, Gynecology and Midwifery  Return Prenatal Note   Assessment/Plan   Plan 23 y.o. G2P1001 at [redacted]w[redacted]d presents for follow-up OB visit. Reviewed prenatal record including previous visit note.  Immunization counseling Tdap done today  Supervision of high risk pregnancy, antepartum (HHS-HCC) Doing well overall today.  Discussed getting CBC drawn frequently for gestational thrombocytopenia- will draw today.  Discussed schedule of prenatal visits from 32 weeks on.  Questions answered about baby's positioning and what options are available if baby is not head down closer to term.  Third trimester precautions discussed.    Thrombocytopenia affecting pregnancy (HHS-HCC) Discussed watching out for petechial rash; looked at pictures together so she knows what to look out for. She wanted us  to be aware that she had RMSF from a tick bite as a child and may have experienced a petechial rash at that point.  CBC drawn today.    Orders Placed This Encounter  Procedures  . Tdap vaccine greater than or equal to 7yo IM  . CBC    Standing Status:   Future    Number of Occurrences:   1    Expected Date:   07/28/2024    Expiration Date:   07/28/2025   Return in about 2 weeks (around 08/11/2024) for ROB.   No future appointments.  For next visit: Continue with routine prenatal care     Subjective   23 y.o. G2P1001 at [redacted]w[redacted]d presents for this follow-up prenatal visit.   Vincentina has questions about what options are available if baby is not vertex at term. She also mentions that she had [facility] Spotted Fever as a child and did have a rash consistent with a petechial rash at that time.   Patient reports: Movement: Present Loss of Fluid: No Bleeding: No Contractions: Not present  Objective   Flow sheet Vitals: Pulse: 102 BP: 111/73 Fundal Height (cm): 32 cm Fetal Heart Rate: 150 Total weight gain: 7.575 kg (16 lb 11.2  oz)  General Appearance  No acute distress, well appearing, and well nourished Pulmonary   Normal work of breathing Neurologic   Alert and oriented to person, place, and time Psychiatric   Mood and affect within normal limits

## 2024-08-12 NOTE — Progress Notes (Signed)
   Provider: Sharene JONETTA Ruth, CNM Division: General Obstetrics, Gynecology and Midwifery  Return Prenatal Note   Assessment/Plan   Plan 23 y.o. G2P1001 at [redacted]w[redacted]d presents for follow-up OB visit. Reviewed prenatal record including previous visit note.  Supervision of high risk pregnancy, antepartum (HHS-HCC) Doing well, +FM, -LOF, - vaginal bleeding, -cramping/ctxs, questions answered, routine precautions reviewed     Immunization counseling RSV and flu vaccines offered-given today  Birth control counseling Discussed contraception. Considering BTL. Has Medicaid. Discussed consent process for medicaid coverage Medicaid consent form signed today  Heartburn during pregnancy in third trimester (HHS-HCC) Taking tums as needed and Pepcid  twice daily w/o good effect Discussed prilosec or Nexium as options Prescribed prilosec today   Orders Placed This Encounter  Procedures  . RSV VACCINE, BIVALENT(PF)(ABRYSVO)  . INFLUENZA VACCINE IIV3(IM)(PF)6 MOS UP   No follow-ups on file.   Future Appointments  Date Time Provider Department Center  08/27/2024 10:40 AM Aleta Plumber, CNM [facility]  09/02/2024 10:00 AM Ruth Sharene D, CNM [facility]  09/09/2024 10:00 AM Ruth Sharene D, CNM [facility]  09/15/2024 10:20 AM Delores Alan Shuck, CNM [facility]   For next visit: Continue with routine prenatal care     Subjective   23 y.o. G2P1001 at [redacted]w[redacted]d presents for this follow-up prenatal visit.   Patient has no concerns.  Patient reports: Movement: Present Loss of Fluid: No Bleeding: No Contractions: Not present  Objective   Flow sheet Vitals: Pulse: 103 BP: 122/73 Fundal Height (cm): 34 cm Fetal Heart Rate: 145 Total weight gain: 9.117 kg (20 lb 1.6 oz)  General Appearance  No acute distress, well appearing, and well nourished Pulmonary   Normal work of breathing Neurologic   Alert and oriented to person,  place, and time Psychiatric   Mood and affect within normal limits

## 2024-08-24 ENCOUNTER — Encounter: Payer: Self-pay | Admitting: Obstetrics and Gynecology

## 2024-08-24 ENCOUNTER — Other Ambulatory Visit: Payer: Self-pay

## 2024-08-24 ENCOUNTER — Observation Stay
Admission: EM | Admit: 2024-08-24 | Discharge: 2024-08-24 | Disposition: A | Discharge status: Home / Self Care | Attending: Obstetrics and Gynecology | Admitting: Obstetrics and Gynecology

## 2024-08-24 DIAGNOSIS — F32A Depression, unspecified: Secondary | ICD-10-CM | POA: Diagnosis not present

## 2024-08-24 DIAGNOSIS — Z3A35 35 weeks gestation of pregnancy: Secondary | ICD-10-CM | POA: Insufficient documentation

## 2024-08-24 DIAGNOSIS — O99343 Other mental disorders complicating pregnancy, third trimester: Secondary | ICD-10-CM | POA: Insufficient documentation

## 2024-08-24 DIAGNOSIS — D696 Thrombocytopenia, unspecified: Secondary | ICD-10-CM | POA: Diagnosis not present

## 2024-08-24 DIAGNOSIS — O368130 Decreased fetal movements, third trimester, not applicable or unspecified: Principal | ICD-10-CM | POA: Insufficient documentation

## 2024-08-24 DIAGNOSIS — O99113 Other diseases of the blood and blood-forming organs and certain disorders involving the immune mechanism complicating pregnancy, third trimester: Secondary | ICD-10-CM | POA: Diagnosis not present

## 2024-08-24 DIAGNOSIS — O26893 Other specified pregnancy related conditions, third trimester: Secondary | ICD-10-CM | POA: Insufficient documentation

## 2024-08-24 DIAGNOSIS — R12 Heartburn: Secondary | ICD-10-CM | POA: Diagnosis not present

## 2024-08-24 DIAGNOSIS — O368190 Decreased fetal movements, unspecified trimester, not applicable or unspecified: Principal | ICD-10-CM | POA: Diagnosis present

## 2024-08-24 NOTE — Discharge Summary (Signed)
 Patient ID: Vanessa Vincent MRN: [id number] DOB/AGE: 2001-07-31 23 y.o.  Admit date: 08/24/2024 Discharge date: 08/24/2024  Admission Diagnoses: 23yo G2P1 at [redacted]w[redacted]d presents for decreased fetal movement.    Discharge Diagnoses: RNST  Factors complicating pregnancy: Thrombocytopenia Heartburn H/o c/s Depression  Prenatal Procedures: NST  Consults: None  Significant Diagnostic Studies:  No results found for this or any previous visit (from the past week).  Treatments: none  Hospital Course:  This is a 23 y.o. G2P1001 with IUP at [redacted]w[redacted]d seen for decreased fetal movement.  Monitored for 1+ hours with reactive NST.  Pt reports feeling baby move now.  She was observed, fetal heart rate monitoring remained reassuring, and she had no signs/symptoms of preterm labor or other maternal-fetal concerns.  She was deemed stable for discharge to home with outpatient follow up.  Discharge Physical Exam:  BP 121/77 (BP Location: Left Arm)   Pulse 92   Temp 98.8 F (37.1 C) (Oral)   Resp 18   Ht 5' 5 (1.651 m)   Wt 92.5 kg   LMP 11/13/2023   BMI 33.95 kg/m   NST: FHR baseline: 145 bpm Variability: moderate Accelerations: yes Decelerations: none Category/reactivity: reactive   TOCO: quiet SVE: deferred      Discharge Condition: Stable  Disposition: Discharge disposition: 01-Home or Self Care        Allergies as of 08/24/2024   No Known Allergies      Medication List     STOP taking these medications    linaclotide  290 MCG Caps capsule Commonly known as: Linzess        TAKE these medications    calcium  carbonate 1250 (500 Ca) MG chewable tablet Commonly known as: OS-CAL Chew 1 tablet by mouth daily.   cyanocobalamin  500 MCG tablet Commonly known as: VITAMIN B12 Take 1 tablet (500 mcg total) by mouth daily.   prenatal multivitamin Tabs tablet Take 1 tablet by mouth daily at 12 noon.   Vitamin D  (Ergocalciferol ) 1.25 MG (50000 UNIT) Caps  capsule Commonly known as: DRISDOL  Take 1 capsule (50,000 Units total) by mouth every 7 (seven) days.        Follow-up Information     [facility]. Schedule an appointment as soon as possible for a visit in 3 day(s).                  SignedBETHA DELON COE, CNM 08/24/2024 7:01 PM

## 2024-08-24 NOTE — OB Triage Note (Signed)
 Pt is being discharged stable and ambulatory per provider order. Educated to return if experiencing LOF, CTX, vaginal bleeding, DFM.  Discharge instructions given and pt verbalizes understanding.

## 2024-08-24 NOTE — Telephone Encounter (Signed)
 Pt returned nurse call. States that since yesterday she has noticed mild cramping and decreased FM. Currently [redacted]w[redacted]d. Denies any VB or LOF. Pt states that she has only had a little bit of water  to drink this morning. Instructed pt to drink something cold and sugary and eat a meal. Pt to then monitor FM. If less than 10 movements in 2 hours, pt to report to L&D for evaluation. Labor precautions given. VU.

## 2024-08-24 NOTE — OB Triage Note (Signed)
 Pt is a G2P1001 at [redacted] weeks gestation reporting to L/D triage with complaint of abdominal cramping and DFM. She states the cramping started yesterday morning when she woke up, rating the pain 5/10. She states she applied a heating pad on a low setting and tried walking but found no relief.  She reports DFM.  She reports to being sexually active 2 days ago. She denies LOF and vaginal bleeding.  VSS. Monitors applied and assessing with initial FHT 145bpm at 1615.  Vanessa Vincent CNM aware of pt arrival.

## 2024-08-24 NOTE — Telephone Encounter (Signed)
 Attempted to reach patient in regards to her MyChart message. LVM asking for return call.
